# Patient Record
Sex: Female | Born: 1965 | Hispanic: No | Marital: Married | Smoking: Former smoker
Health system: Southern US, Community
[De-identification: ages and names within clinical notes are randomized; demographics above are authoritative.]

## PROBLEM LIST (undated history)

## (undated) DIAGNOSIS — F329 Major depressive disorder, single episode, unspecified: Secondary | ICD-10-CM

## (undated) DIAGNOSIS — R569 Unspecified convulsions: Secondary | ICD-10-CM

## (undated) DIAGNOSIS — M549 Dorsalgia, unspecified: Secondary | ICD-10-CM

## (undated) DIAGNOSIS — I809 Phlebitis and thrombophlebitis of unspecified site: Secondary | ICD-10-CM

## (undated) DIAGNOSIS — F419 Anxiety disorder, unspecified: Secondary | ICD-10-CM

## (undated) DIAGNOSIS — G43909 Migraine, unspecified, not intractable, without status migrainosus: Secondary | ICD-10-CM

## (undated) DIAGNOSIS — Z973 Presence of spectacles and contact lenses: Secondary | ICD-10-CM

## (undated) DIAGNOSIS — I82409 Acute embolism and thrombosis of unspecified deep veins of unspecified lower extremity: Secondary | ICD-10-CM

## (undated) DIAGNOSIS — E119 Type 2 diabetes mellitus without complications: Secondary | ICD-10-CM

## (undated) DIAGNOSIS — G629 Polyneuropathy, unspecified: Secondary | ICD-10-CM

## (undated) DIAGNOSIS — F32A Depression, unspecified: Secondary | ICD-10-CM

## (undated) DIAGNOSIS — D649 Anemia, unspecified: Secondary | ICD-10-CM

## (undated) HISTORY — PX: ESOPHAGOSCOPY WITH DILITATION: SHX5618

## (undated) HISTORY — PX: RECTAL SURGERY: SHX760

## (undated) HISTORY — PX: PICC LINE REMOVAL (ARMC HX): HXRAD1261

## (undated) HISTORY — PX: OTHER SURGICAL HISTORY: SHX169

## (undated) HISTORY — PX: LUMBAR FUSION: SHX111

## (undated) HISTORY — PX: COLONOSCOPY: SHX174

---

## 1999-03-05 HISTORY — PX: GASTRIC BYPASS: SHX52

## 2000-03-04 HISTORY — PX: OTHER SURGICAL HISTORY: SHX169

## 2004-01-03 ENCOUNTER — Ambulatory Visit: Payer: Self-pay | Admitting: Pain Medicine

## 2004-11-21 ENCOUNTER — Ambulatory Visit: Payer: Self-pay | Admitting: Family Medicine

## 2005-06-13 ENCOUNTER — Other Ambulatory Visit: Payer: Self-pay

## 2005-06-13 ENCOUNTER — Observation Stay: Payer: Self-pay | Admitting: Internal Medicine

## 2005-07-10 ENCOUNTER — Ambulatory Visit: Payer: Self-pay | Admitting: Family Medicine

## 2005-08-22 ENCOUNTER — Ambulatory Visit: Payer: Self-pay | Admitting: Family Medicine

## 2006-03-04 HISTORY — PX: ABDOMINAL HYSTERECTOMY: SHX81

## 2007-05-11 ENCOUNTER — Ambulatory Visit: Payer: Self-pay | Admitting: Family Medicine

## 2007-05-11 ENCOUNTER — Other Ambulatory Visit: Payer: Self-pay

## 2007-05-11 ENCOUNTER — Emergency Department: Payer: Self-pay | Admitting: Emergency Medicine

## 2009-02-12 ENCOUNTER — Emergency Department: Payer: Self-pay | Admitting: Internal Medicine

## 2011-03-12 ENCOUNTER — Emergency Department: Payer: Self-pay | Admitting: *Deleted

## 2011-03-12 LAB — COMPREHENSIVE METABOLIC PANEL
Albumin: 3.4 g/dL (ref 3.4–5.0)
Anion Gap: 10 (ref 7–16)
BUN: 15 mg/dL (ref 7–18)
Bilirubin,Total: 0.3 mg/dL (ref 0.2–1.0)
Chloride: 107 mmol/L (ref 98–107)
Creatinine: 0.34 mg/dL — ABNORMAL LOW (ref 0.60–1.30)
Glucose: 85 mg/dL (ref 65–99)
Osmolality: 281 (ref 275–301)
Potassium: 4 mmol/L (ref 3.5–5.1)
SGOT(AST): 29 U/L (ref 15–37)
SGPT (ALT): 27 U/L
Total Protein: 6.6 g/dL (ref 6.4–8.2)

## 2011-03-12 LAB — CK TOTAL AND CKMB (NOT AT ARMC)
CK, Total: 74 U/L (ref 21–215)
CK-MB: 0.7 ng/mL (ref 0.5–3.6)

## 2011-03-12 LAB — CBC
HCT: 32.7 % — ABNORMAL LOW (ref 35.0–47.0)
MCH: 30.2 pg (ref 26.0–34.0)
MCHC: 33.3 g/dL (ref 32.0–36.0)
MCV: 91 fL (ref 80–100)
Platelet: 203 10*3/uL (ref 150–440)
RDW: 15.5 % — ABNORMAL HIGH (ref 11.5–14.5)
WBC: 8.3 10*3/uL (ref 3.6–11.0)

## 2011-03-19 ENCOUNTER — Emergency Department: Payer: Self-pay | Admitting: Emergency Medicine

## 2011-03-21 ENCOUNTER — Ambulatory Visit: Payer: Self-pay | Admitting: Family Medicine

## 2011-06-24 ENCOUNTER — Emergency Department: Payer: Self-pay | Admitting: Emergency Medicine

## 2011-09-06 ENCOUNTER — Encounter (HOSPITAL_COMMUNITY): Payer: Self-pay | Admitting: Pharmacy Technician

## 2011-09-09 DIAGNOSIS — M5126 Other intervertebral disc displacement, lumbar region: Secondary | ICD-10-CM | POA: Diagnosis present

## 2011-09-10 NOTE — Pre-Procedure Instructions (Signed)
20 Alyssa Sanders  09/10/2011   Your procedure is scheduled on:  Monday September 16, 2011.  Report to [facility] at 1030 AM.  Call this number if you have problems the morning of surgery: [phone]   Remember:   Do not eat food:After Midnight.    Take these medicines the morning of surgery with A SIP OF WATER: Tramadol (Ultram) if needed for pain.   Do not wear jewelry, make-up or nail polish.  Do not wear lotions, powders, or perfumes.   Do not shave 48 hours prior to surgery.   Do not bring valuables to the hospital.  Contacts, dentures or bridgework may not be worn into surgery.  Leave suitcase in the car. After surgery it may be brought to your room.  For patients admitted to the hospital, checkout time is 11:00 AM the day of discharge.   Patients discharged the day of surgery will not be allowed to drive home.  Name and phone number of your driver:   Special Instructions: CHG Shower Use Special Wash: 1/2 bottle night before surgery and 1/2 bottle morning of surgery.   Please read over the following fact sheets that you were given: Pain Booklet, Coughing and Deep Breathing, MRSA Information and Surgical Site Infection Prevention

## 2011-09-11 ENCOUNTER — Encounter (HOSPITAL_COMMUNITY): Payer: Self-pay

## 2011-09-11 ENCOUNTER — Encounter (HOSPITAL_COMMUNITY)
Admission: RE | Admit: 2011-09-11 | Discharge: 2011-09-11 | Disposition: A | Discharge status: Home / Self Care | Payer: Worker's Compensation | Source: Ambulatory Visit | Attending: Physician Assistant | Admitting: Physician Assistant

## 2011-09-11 ENCOUNTER — Encounter (HOSPITAL_COMMUNITY)
Admission: RE | Admit: 2011-09-11 | Discharge: 2011-09-11 | Disposition: A | Discharge status: Home / Self Care | Payer: Worker's Compensation | Source: Ambulatory Visit | Attending: Orthopaedic Surgery | Admitting: Orthopaedic Surgery

## 2011-09-11 HISTORY — DX: Migraine, unspecified, not intractable, without status migrainosus: G43.909

## 2011-09-11 HISTORY — DX: Anemia, unspecified: D64.9

## 2011-09-11 LAB — URINALYSIS, ROUTINE W REFLEX MICROSCOPIC
Bilirubin Urine: NEGATIVE
Glucose, UA: NEGATIVE mg/dL
Hgb urine dipstick: NEGATIVE
Ketones, ur: NEGATIVE mg/dL
pH: 6.5 (ref 5.0–8.0)

## 2011-09-11 LAB — BASIC METABOLIC PANEL
BUN: 11 mg/dL (ref 6–23)
CO2: 28 mEq/L (ref 19–32)
Chloride: 104 mEq/L (ref 96–112)
Creatinine, Ser: 0.48 mg/dL — ABNORMAL LOW (ref 0.50–1.10)
Glucose, Bld: 89 mg/dL (ref 70–99)

## 2011-09-11 LAB — CBC
HCT: 33.5 % — ABNORMAL LOW (ref 36.0–46.0)
MCH: 29.2 pg (ref 26.0–34.0)
MCV: 85.9 fL (ref 78.0–100.0)
RDW: 15.1 % (ref 11.5–15.5)
WBC: 7.3 10*3/uL (ref 4.0–10.5)

## 2011-09-11 LAB — URINE MICROSCOPIC-ADD ON

## 2011-09-11 NOTE — Progress Notes (Signed)
Patient had a stress test approximately 10-15 years ago, but denied having a cardiac cath or sleep study.

## 2011-09-15 MED ORDER — CEFAZOLIN SODIUM-DEXTROSE 2-3 GM-% IV SOLR
2.0000 g | INTRAVENOUS | Status: AC
  Administered 2011-09-16: 2 g via INTRAVENOUS
  Filled 2011-09-15: qty 50

## 2011-09-16 ENCOUNTER — Encounter (HOSPITAL_COMMUNITY): Payer: Self-pay | Admitting: Certified Registered"

## 2011-09-16 ENCOUNTER — Encounter (HOSPITAL_COMMUNITY): Payer: Self-pay | Admitting: *Deleted

## 2011-09-16 ENCOUNTER — Ambulatory Visit (HOSPITAL_COMMUNITY): Payer: Worker's Compensation | Admitting: Certified Registered"

## 2011-09-16 ENCOUNTER — Observation Stay (HOSPITAL_COMMUNITY)
Admission: RE | Admit: 2011-09-16 | Discharge: 2011-09-18 | Disposition: A | Discharge status: Home / Self Care | Payer: Worker's Compensation | Source: Ambulatory Visit | Attending: Orthopaedic Surgery | Admitting: Orthopaedic Surgery

## 2011-09-16 ENCOUNTER — Encounter (HOSPITAL_COMMUNITY)
Admission: RE | Disposition: A | Discharge status: Home / Self Care | Payer: Self-pay | Source: Ambulatory Visit | Attending: Orthopaedic Surgery

## 2011-09-16 ENCOUNTER — Ambulatory Visit (HOSPITAL_COMMUNITY): Payer: Worker's Compensation

## 2011-09-16 DIAGNOSIS — J4489 Other specified chronic obstructive pulmonary disease: Secondary | ICD-10-CM | POA: Insufficient documentation

## 2011-09-16 DIAGNOSIS — Z01818 Encounter for other preprocedural examination: Secondary | ICD-10-CM | POA: Insufficient documentation

## 2011-09-16 DIAGNOSIS — J449 Chronic obstructive pulmonary disease, unspecified: Secondary | ICD-10-CM | POA: Insufficient documentation

## 2011-09-16 DIAGNOSIS — Z01811 Encounter for preprocedural respiratory examination: Secondary | ICD-10-CM | POA: Insufficient documentation

## 2011-09-16 DIAGNOSIS — Z01812 Encounter for preprocedural laboratory examination: Secondary | ICD-10-CM | POA: Insufficient documentation

## 2011-09-16 DIAGNOSIS — M5126 Other intervertebral disc displacement, lumbar region: Principal | ICD-10-CM | POA: Diagnosis present

## 2011-09-16 DIAGNOSIS — E119 Type 2 diabetes mellitus without complications: Secondary | ICD-10-CM | POA: Insufficient documentation

## 2011-09-16 HISTORY — PX: LUMBAR LAMINECTOMY: SHX95

## 2011-09-16 LAB — GLUCOSE, CAPILLARY

## 2011-09-16 SURGERY — MICRODISCECTOMY LUMBAR LAMINECTOMY
Anesthesia: General | Site: Spine Lumbar | Laterality: Bilateral | Wound class: Clean

## 2011-09-16 MED ORDER — PROPOFOL 10 MG/ML IV EMUL
INTRAVENOUS | Status: DC | PRN
  Administered 2011-09-16: 40 mg via INTRAVENOUS
  Administered 2011-09-16: 180 mg via INTRAVENOUS

## 2011-09-16 MED ORDER — BUPIVACAINE HCL (PF) 0.25 % IJ SOLN
INTRAMUSCULAR | Status: AC
  Filled 2011-09-16: qty 30

## 2011-09-16 MED ORDER — PROPOFOL 10 MG/ML IV EMUL: INTRAVENOUS | Status: DC | PRN

## 2011-09-16 MED ORDER — CEFAZOLIN SODIUM 1-5 GM-% IV SOLN
1.0000 g | Freq: Three times a day (TID) | INTRAVENOUS | Status: AC
  Administered 2011-09-16 – 2011-09-17 (×2): 1 g via INTRAVENOUS
  Filled 2011-09-16 (×2): qty 50

## 2011-09-16 MED ORDER — ACETAMINOPHEN 325 MG PO TABS: 650.0000 mg | ORAL_TABLET | ORAL | Status: DC | PRN

## 2011-09-16 MED ORDER — PANTOPRAZOLE SODIUM 40 MG IV SOLR
40.0000 mg | Freq: Every day | INTRAVENOUS | Status: DC
  Administered 2011-09-16: 40 mg via INTRAVENOUS
  Filled 2011-09-16 (×2): qty 40

## 2011-09-16 MED ORDER — GLYCOPYRROLATE 0.2 MG/ML IJ SOLN
INTRAMUSCULAR | Status: DC | PRN
  Administered 2011-09-16: .8 mg via INTRAVENOUS

## 2011-09-16 MED ORDER — HYDROMORPHONE HCL PF 1 MG/ML IJ SOLN
INTRAMUSCULAR | Status: AC
  Filled 2011-09-16: qty 1

## 2011-09-16 MED ORDER — KETOROLAC TROMETHAMINE 30 MG/ML IJ SOLN
30.0000 mg | Freq: Four times a day (QID) | INTRAMUSCULAR | Status: AC
  Administered 2011-09-16 – 2011-09-17 (×4): 30 mg via INTRAVENOUS
  Filled 2011-09-16 (×4): qty 1

## 2011-09-16 MED ORDER — PROMETHAZINE HCL 25 MG/ML IJ SOLN
6.2500 mg | INTRAMUSCULAR | Status: DC | PRN
  Administered 2011-09-16: 6.25 mg via INTRAVENOUS

## 2011-09-16 MED ORDER — ACETAMINOPHEN 650 MG RE SUPP: 650.0000 mg | RECTAL | Status: DC | PRN

## 2011-09-16 MED ORDER — DOCUSATE SODIUM 100 MG PO CAPS
100.0000 mg | ORAL_CAPSULE | Freq: Two times a day (BID) | ORAL | Status: DC
  Administered 2011-09-16 – 2011-09-18 (×4): 100 mg via ORAL
  Filled 2011-09-16 (×6): qty 1

## 2011-09-16 MED ORDER — SENNOSIDES-DOCUSATE SODIUM 8.6-50 MG PO TABS: 1.0000 | ORAL_TABLET | Freq: Every evening | ORAL | Status: DC | PRN

## 2011-09-16 MED ORDER — PHENYLEPHRINE HCL 10 MG/ML IJ SOLN
INTRAMUSCULAR | Status: DC | PRN
  Administered 2011-09-16 (×2): 80 ug via INTRAVENOUS
  Administered 2011-09-16 (×3): 40 ug via INTRAVENOUS

## 2011-09-16 MED ORDER — MIDAZOLAM HCL 2 MG/2ML IJ SOLN: 0.5000 mg | Freq: Once | INTRAMUSCULAR | Status: DC | PRN

## 2011-09-16 MED ORDER — CHLORHEXIDINE GLUCONATE 4 % EX LIQD: 60.0000 mL | Freq: Once | CUTANEOUS | Status: DC

## 2011-09-16 MED ORDER — METHOCARBAMOL 100 MG/ML IJ SOLN
500.0000 mg | Freq: Four times a day (QID) | INTRAVENOUS | Status: DC | PRN
  Administered 2011-09-16 – 2011-09-17 (×4): 500 mg via INTRAVENOUS
  Filled 2011-09-16 (×7): qty 5

## 2011-09-16 MED ORDER — MEPERIDINE HCL 25 MG/ML IJ SOLN: 6.2500 mg | INTRAMUSCULAR | Status: DC | PRN

## 2011-09-16 MED ORDER — NEOSTIGMINE METHYLSULFATE 1 MG/ML IJ SOLN
INTRAMUSCULAR | Status: DC | PRN
  Administered 2011-09-16: 5 mg via INTRAVENOUS

## 2011-09-16 MED ORDER — ONDANSETRON HCL 4 MG/2ML IJ SOLN
INTRAMUSCULAR | Status: DC | PRN
  Administered 2011-09-16: 4 mg via INTRAVENOUS

## 2011-09-16 MED ORDER — AMITRIPTYLINE HCL 100 MG PO TABS
100.0000 mg | ORAL_TABLET | Freq: Every day | ORAL | Status: DC
  Administered 2011-09-16 – 2011-09-17 (×2): 100 mg via ORAL
  Filled 2011-09-16 (×3): qty 1

## 2011-09-16 MED ORDER — PHENOL 1.4 % MT LIQD
1.0000 | OROMUCOSAL | Status: DC | PRN
  Administered 2011-09-16: 1 via OROMUCOSAL
  Filled 2011-09-16: qty 177

## 2011-09-16 MED ORDER — METHOCARBAMOL 500 MG PO TABS
ORAL_TABLET | ORAL | Status: AC
  Filled 2011-09-16: qty 1

## 2011-09-16 MED ORDER — SUMATRIPTAN SUCCINATE 50 MG PO TABS
50.0000 mg | ORAL_TABLET | ORAL | Status: DC | PRN
  Filled 2011-09-16: qty 1

## 2011-09-16 MED ORDER — KCL IN DEXTROSE-NACL 20-5-0.45 MEQ/L-%-% IV SOLN
INTRAVENOUS | Status: DC
  Administered 2011-09-16: 16:00:00 via INTRAVENOUS
  Filled 2011-09-16 (×5): qty 1000

## 2011-09-16 MED ORDER — SODIUM CHLORIDE 0.9 % IJ SOLN: 3.0000 mL | INTRAMUSCULAR | Status: DC | PRN

## 2011-09-16 MED ORDER — BUPIVACAINE HCL (PF) 0.25 % IJ SOLN
INTRAMUSCULAR | Status: DC | PRN
  Administered 2011-09-16: 10 mL

## 2011-09-16 MED ORDER — HYDROMORPHONE HCL PF 1 MG/ML IJ SOLN
0.5000 mg | INTRAMUSCULAR | Status: DC | PRN
  Administered 2011-09-16 – 2011-09-18 (×11): 1 mg via INTRAVENOUS
  Filled 2011-09-16 (×12): qty 1

## 2011-09-16 MED ORDER — ONDANSETRON HCL 4 MG/2ML IJ SOLN
4.0000 mg | INTRAMUSCULAR | Status: DC | PRN
  Administered 2011-09-16 – 2011-09-17 (×2): 4 mg via INTRAVENOUS
  Filled 2011-09-16 (×2): qty 2

## 2011-09-16 MED ORDER — DEXTROSE 5 % IV SOLN
INTRAVENOUS | Status: DC | PRN
  Administered 2011-09-16: 12:00:00 via INTRAVENOUS

## 2011-09-16 MED ORDER — BISACODYL 10 MG RE SUPP: 10.0000 mg | Freq: Every day | RECTAL | Status: DC | PRN

## 2011-09-16 MED ORDER — TRAMADOL HCL 50 MG PO TABS
50.0000 mg | ORAL_TABLET | Freq: Four times a day (QID) | ORAL | Status: DC | PRN
  Administered 2011-09-17 – 2011-09-18 (×2): 50 mg via ORAL
  Filled 2011-09-16 (×2): qty 1

## 2011-09-16 MED ORDER — LACTATED RINGERS IV SOLN
INTRAVENOUS | Status: DC | PRN
  Administered 2011-09-16 (×2): via INTRAVENOUS

## 2011-09-16 MED ORDER — HYDROCODONE-ACETAMINOPHEN 5-325 MG PO TABS
1.0000 | ORAL_TABLET | ORAL | Status: DC | PRN
  Administered 2011-09-16: 1 via ORAL
  Administered 2011-09-17 (×2): 2 via ORAL
  Filled 2011-09-16: qty 2
  Filled 2011-09-16: qty 1
  Filled 2011-09-16 (×3): qty 2

## 2011-09-16 MED ORDER — MIDAZOLAM HCL 5 MG/5ML IJ SOLN
INTRAMUSCULAR | Status: DC | PRN
  Administered 2011-09-16 (×2): 1 mg via INTRAVENOUS

## 2011-09-16 MED ORDER — HYDROMORPHONE HCL PF 1 MG/ML IJ SOLN
0.2500 mg | INTRAMUSCULAR | Status: DC | PRN
  Administered 2011-09-16 (×2): 0.5 mg via INTRAVENOUS

## 2011-09-16 MED ORDER — LIDOCAINE HCL (CARDIAC) 20 MG/ML IV SOLN
INTRAVENOUS | Status: DC | PRN
  Administered 2011-09-16: 100 mg via INTRAVENOUS

## 2011-09-16 MED ORDER — KETOROLAC TROMETHAMINE 30 MG/ML IJ SOLN
INTRAMUSCULAR | Status: AC
  Filled 2011-09-16: qty 1

## 2011-09-16 MED ORDER — SODIUM CHLORIDE 0.9 % IJ SOLN: 3.0000 mL | Freq: Two times a day (BID) | INTRAMUSCULAR | Status: DC

## 2011-09-16 MED ORDER — SODIUM CHLORIDE 0.9 % IV SOLN
250.0000 mL | INTRAVENOUS | Status: DC
  Administered 2011-09-16: 250 mL via INTRAVENOUS

## 2011-09-16 MED ORDER — OXYCODONE-ACETAMINOPHEN 5-325 MG PO TABS
ORAL_TABLET | ORAL | Status: AC
  Filled 2011-09-16: qty 2

## 2011-09-16 MED ORDER — 0.9 % SODIUM CHLORIDE (POUR BTL) OPTIME
TOPICAL | Status: DC | PRN
  Administered 2011-09-16: 1000 mL

## 2011-09-16 MED ORDER — ROCURONIUM BROMIDE 100 MG/10ML IV SOLN
INTRAVENOUS | Status: DC | PRN
  Administered 2011-09-16: 50 mg via INTRAVENOUS

## 2011-09-16 MED ORDER — METHOCARBAMOL 500 MG PO TABS
500.0000 mg | ORAL_TABLET | Freq: Four times a day (QID) | ORAL | Status: DC | PRN
  Administered 2011-09-16 – 2011-09-18 (×2): 500 mg via ORAL
  Filled 2011-09-16 (×3): qty 1

## 2011-09-16 MED ORDER — FENTANYL CITRATE 0.05 MG/ML IJ SOLN
INTRAMUSCULAR | Status: DC | PRN
  Administered 2011-09-16: 50 ug via INTRAVENOUS
  Administered 2011-09-16: 100 ug via INTRAVENOUS
  Administered 2011-09-16: 50 ug via INTRAVENOUS

## 2011-09-16 MED ORDER — OXYCODONE-ACETAMINOPHEN 5-325 MG PO TABS
1.0000 | ORAL_TABLET | ORAL | Status: DC | PRN
  Administered 2011-09-16 (×2): 2 via ORAL
  Administered 2011-09-17 (×2): 1 via ORAL
  Administered 2011-09-17 – 2011-09-18 (×6): 2 via ORAL
  Filled 2011-09-16 (×9): qty 2

## 2011-09-16 MED ORDER — PROMETHAZINE HCL 25 MG/ML IJ SOLN
INTRAMUSCULAR | Status: AC
  Filled 2011-09-16: qty 1

## 2011-09-16 MED ORDER — KCL IN DEXTROSE-NACL 20-5-0.45 MEQ/L-%-% IV SOLN
INTRAVENOUS | Status: AC
  Filled 2011-09-16: qty 1000

## 2011-09-16 MED ORDER — TRAMADOL HCL ER 100 MG PO TB24: 200.0000 mg | ORAL_TABLET | Freq: Every day | ORAL | Status: DC | PRN

## 2011-09-16 MED ORDER — LACTATED RINGERS IV SOLN
INTRAVENOUS | Status: DC
  Administered 2011-09-16: 12:00:00 via INTRAVENOUS

## 2011-09-16 MED ORDER — MENTHOL 3 MG MT LOZG: 1.0000 | LOZENGE | OROMUCOSAL | Status: DC | PRN

## 2011-09-16 MED ORDER — OXYCODONE-ACETAMINOPHEN 5-325 MG PO TABS: 1.0000 | ORAL_TABLET | ORAL | Status: AC | PRN

## 2011-09-16 SURGICAL SUPPLY — 49 items
BENZOIN TINCTURE PRP APPL 2/3 (GAUZE/BANDAGES/DRESSINGS) ×2 IMPLANT
BUR ROUND FLUTED 4 SOFT TCH (BURR) ×2 IMPLANT
CLOTH BEACON ORANGE TIMEOUT ST (SAFETY) ×2 IMPLANT
CORDS BIPOLAR (ELECTRODE) ×2 IMPLANT
COVER SURGICAL LIGHT HANDLE (MISCELLANEOUS) ×2 IMPLANT
DERMABOND ADVANCED (GAUZE/BANDAGES/DRESSINGS) ×1
DERMABOND ADVANCED .7 DNX12 (GAUZE/BANDAGES/DRESSINGS) ×1 IMPLANT
DRAPE MICROSCOPE LEICA (MISCELLANEOUS) ×2 IMPLANT
DRAPE PROXIMA HALF (DRAPES) ×4 IMPLANT
DRESSING ADAPTIC 1/2  N-ADH (PACKING) ×2 IMPLANT
DRSG EMULSION OIL 3X3 NADH (GAUZE/BANDAGES/DRESSINGS) ×2 IMPLANT
DRSG MEPILEX BORDER 4X8 (GAUZE/BANDAGES/DRESSINGS) ×2 IMPLANT
DURAPREP 26ML APPLICATOR (WOUND CARE) ×2 IMPLANT
ELECT REM PT RETURN 9FT ADLT (ELECTROSURGICAL) ×2
ELECTRODE REM PT RTRN 9FT ADLT (ELECTROSURGICAL) ×1 IMPLANT
GLOVE BIOGEL PI IND STRL 7.5 (GLOVE) ×2 IMPLANT
GLOVE BIOGEL PI IND STRL 8 (GLOVE) ×1 IMPLANT
GLOVE BIOGEL PI INDICATOR 7.5 (GLOVE) ×2
GLOVE BIOGEL PI INDICATOR 8 (GLOVE) ×1
GLOVE ECLIPSE 7.0 STRL STRAW (GLOVE) ×4 IMPLANT
GLOVE ORTHO TXT STRL SZ7.5 (GLOVE) ×2 IMPLANT
GOWN PREVENTION PLUS LG XLONG (DISPOSABLE) IMPLANT
GOWN STRL NON-REIN LRG LVL3 (GOWN DISPOSABLE) ×6 IMPLANT
KIT BASIN OR (CUSTOM PROCEDURE TRAY) ×2 IMPLANT
KIT ROOM TURNOVER OR (KITS) ×2 IMPLANT
MANIFOLD NEPTUNE II (INSTRUMENTS) ×2 IMPLANT
NDL SUT .5 MAYO 1.404X.05X (NEEDLE) ×1 IMPLANT
NEEDLE HYPO 25GX1X1/2 BEV (NEEDLE) ×2 IMPLANT
NEEDLE MAYO TAPER (NEEDLE) ×1
NEEDLE SPNL 18GX3.5 QUINCKE PK (NEEDLE) ×2 IMPLANT
NS IRRIG 1000ML POUR BTL (IV SOLUTION) ×2 IMPLANT
PACK LAMINECTOMY ORTHO (CUSTOM PROCEDURE TRAY) ×2 IMPLANT
PAD ARMBOARD 7.5X6 YLW CONV (MISCELLANEOUS) ×4 IMPLANT
PATTIES SURGICAL .5 X.5 (GAUZE/BANDAGES/DRESSINGS) IMPLANT
PATTIES SURGICAL .75X.75 (GAUZE/BANDAGES/DRESSINGS) IMPLANT
SPONGE GAUZE 4X4 12PLY (GAUZE/BANDAGES/DRESSINGS) ×2 IMPLANT
STAPLER VISISTAT 35W (STAPLE) ×2 IMPLANT
STRIP CLOSURE SKIN 1/2X4 (GAUZE/BANDAGES/DRESSINGS) ×2 IMPLANT
SUT VIC AB 2-0 CT1 27 (SUTURE) ×1
SUT VIC AB 2-0 CT1 TAPERPNT 27 (SUTURE) ×1 IMPLANT
SUT VICRYL 0 TIES 12 18 (SUTURE) ×2 IMPLANT
SUT VICRYL 4-0 PS2 18IN ABS (SUTURE) IMPLANT
SUT VICRYL AB 2 0 TIES (SUTURE) ×2 IMPLANT
SYR 20ML ECCENTRIC (SYRINGE) IMPLANT
SYR CONTROL 10ML LL (SYRINGE) ×2 IMPLANT
TAPE CLOTH SURG 4X10 WHT LF (GAUZE/BANDAGES/DRESSINGS) ×2 IMPLANT
TOWEL OR 17X24 6PK STRL BLUE (TOWEL DISPOSABLE) ×2 IMPLANT
TOWEL OR 17X26 10 PK STRL BLUE (TOWEL DISPOSABLE) ×2 IMPLANT
WATER STERILE IRR 1000ML POUR (IV SOLUTION) ×2 IMPLANT

## 2011-09-16 NOTE — Preoperative (Signed)
Beta Blockers   Reason not to administer Beta Blockers:Not Applicable 

## 2011-09-16 NOTE — Anesthesia Postprocedure Evaluation (Signed)
  Anesthesia Post-op Note  Patient: Alyssa Sanders  Procedure(s) Performed: Procedure(s) (LRB): MICRODISCECTOMY LUMBAR LAMINECTOMY (Bilateral)  Patient Location: PACU  Anesthesia Type: General  Level of Consciousness: awake, alert  and oriented  Airway and Oxygen Therapy: Patient Spontanous Breathing and Patient connected to nasal cannula oxygen  Post-op Pain: none  Post-op Assessment: Post-op Vital signs reviewed, Patient's Cardiovascular Status Stable, Respiratory Function Stable, Patent Airway, No signs of Nausea or vomiting and Pain level controlled  Post-op Vital Signs: Reviewed and stable  Complications: No apparent anesthesia complications
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
## 2011-09-16 NOTE — Transfer of Care (Signed)
Immediate Anesthesia Transfer of Care Note  Patient: Alyssa Sanders  Procedure(s) Performed: Procedure(s) (LRB): MICRODISCECTOMY LUMBAR LAMINECTOMY (Bilateral)  Patient Location: PACU  Anesthesia Type: General  Level of Consciousness: awake, oriented and patient cooperative  Airway & Oxygen Therapy: Patient Spontanous Breathing and Patient connected to nasal cannula oxygen  Post-op Assessment: Report given to PACU RN, Post -op Vital signs reviewed and stable and Patient moving all extremities  Post vital signs: Reviewed and stable  Complications: No apparent anesthesia complications

## 2011-09-16 NOTE — H&P (View-Only) (Signed)
Patient had a stress test approximately 10-15 years ago, but denied having a cardiac cath or sleep study.  

## 2011-09-16 NOTE — Anesthesia Preprocedure Evaluation (Addendum)
Anesthesia Evaluation  Patient identified by MRN, date of birth, ID band Patient awake, Patient confused and Patient unresponsive    Reviewed: Allergy & Precautions, H&P , NPO status , Patient's Chart, lab work & pertinent test results, reviewed documented beta blocker date and time   History of Anesthesia Complications Negative for: history of anesthetic complications  Airway Mallampati: II TM Distance: >3 FB Neck ROM: Full    Dental  (+) Teeth Intact, Dental Advisory Given, Loose and Chipped   Pulmonary COPD (last inhaler march 1013) COPD inhaler, former smoker (quit 12 years ),    Pulmonary exam normal       Cardiovascular negative cardio ROS  Rhythm:Regular Rate:Normal     Neuro/Psych  Headaches,    GI/Hepatic negative GI ROS, Neg liver ROS,   Endo/Other  Well Controlled, Type obesity  Renal/GU negative Renal ROS     Musculoskeletal   Abdominal (+) + obese,   Peds  Hematology   Anesthesia Other Findings Right upper chip, left lower chip  Reproductive/Obstetrics                         Anesthesia Physical Anesthesia Plan  ASA: III  Anesthesia Plan: General   Post-op Pain Management:    Induction: Intravenous  Airway Management Planned: Oral ETT  Additional Equipment:   Intra-op Plan:   Post-operative Plan: Extubation in OR  Informed Consent: I have reviewed the patients History and Physical, chart, labs and discussed the procedure including the risks, benefits and alternatives for the proposed anesthesia with the patient or authorized representative who has indicated his/her understanding and acceptance.   Dental advisory given  Plan Discussed with: CRNA and Surgeon  Anesthesia Plan Comments: (Plan routine monitors, GETA)        Anesthesia Quick Evaluation

## 2011-09-16 NOTE — Brief Op Note (Cosign Needed)
09/16/2011  2:28 PM  PATIENT:  Alyssa Sanders  46 y.o. 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  PROCEDURE:  Procedure(s) (LRB): MICRODISCECTOMY LUMBAR LAMINECTOMY (Bilateral) L4-5 SURGEON:  Surgeon(s) and Role:    * Eldred Manges, MD - Primary  PHYSICIAN ASSISTANT: Maud Deed PAC  ASSISTANTS: none   ANESTHESIA:   general  EBL:  Total I/O In: 1050 [I.V.:1050] Out: 10 [Blood:10]  BLOOD ADMINISTERED:none  DRAINS: none   LOCAL MEDICATIONS USED:  MARCAINE     SPECIMEN:  No Specimen  DISPOSITION OF SPECIMEN:  N/A  COUNTS:  YES  TOURNIQUET:  * No tourniquets in log *  DICTATION: .Note written in EPIC  PLAN OF CARE: Admit for overnight observation  PATIENT DISPOSITION:  PACU - hemodynamically stable.   Delay start of Pharmacological VTE agent (>24hrs) due to surgical blood loss or risk of bleeding: yes

## 2011-09-16 NOTE — Interval H&P Note (Signed)
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 After consideration of risks, benefits and other options for treatment, the patient has consented to  Procedure(s) (LRB): MICRODISCECTOMY LUMBAR LAMINECTOMY (N/A) as a surgical intervention .  The patient's history has been reviewed, patient examined, no change in status, stable for surgery.  I have reviewed the patients' chart and labs.  Questions were answered to the patient's satisfaction.     [name] C

## 2011-09-16 NOTE — Anesthesia Procedure Notes (Signed)
Procedure Name: Intubation Date/Time: 09/16/2011 12:39 PM Performed by: Darcey Nora B Pre-anesthesia Checklist: Patient identified, Emergency Drugs available, Suction available and Patient being monitored Patient Re-evaluated:Patient Re-evaluated prior to inductionOxygen Delivery Method: Circle system utilized Preoxygenation: Pre-oxygenation with 100% oxygen Intubation Type: IV induction Ventilation: Mask ventilation without difficulty Laryngoscope Size: Mac and 3 Grade View: Grade II Tube type: Oral Tube size: 7.5 mm Number of attempts: 1 Airway Equipment and Method: Stylet Placement Confirmation: ETT inserted through vocal cords under direct vision,  breath sounds checked- equal and bilateral and positive ETCO2 Secured at: 21 (cm at teeth) cm Tube secured with: Tape Dental Injury: Teeth and Oropharynx as per pre-operative assessment

## 2011-09-17 ENCOUNTER — Encounter (HOSPITAL_COMMUNITY): Payer: Self-pay | Admitting: Orthopaedic Surgery

## 2011-09-17 MED ORDER — LORAZEPAM 0.5 MG PO TABS
0.5000 mg | ORAL_TABLET | Freq: Four times a day (QID) | ORAL | Status: DC | PRN
  Administered 2011-09-17: 0.5 mg via ORAL
  Filled 2011-09-17: qty 1

## 2011-09-17 MED ORDER — PANTOPRAZOLE SODIUM 40 MG PO TBEC
40.0000 mg | DELAYED_RELEASE_TABLET | Freq: Every day | ORAL | Status: DC
  Administered 2011-09-17: 40 mg via ORAL
  Filled 2011-09-17: qty 1

## 2011-09-17 MED ORDER — KETOROLAC TROMETHAMINE 30 MG/ML IJ SOLN
INTRAMUSCULAR | Status: AC
  Filled 2011-09-17: qty 1

## 2011-09-17 MED ORDER — KETOROLAC TROMETHAMINE 30 MG/ML IJ SOLN
30.0000 mg | Freq: Four times a day (QID) | INTRAMUSCULAR | Status: DC
  Administered 2011-09-17 – 2011-09-18 (×3): 30 mg via INTRAVENOUS
  Filled 2011-09-17 (×2): qty 1

## 2011-09-17 NOTE — Plan of Care (Signed)
Patient requests to have bed elevated high off the ground and refused to have the bed lowered in the Safe Position down on the floor. Patient educated on SAFE measures and still refused. Bed left in high position.

## 2011-09-17 NOTE — Progress Notes (Signed)
Subjective: 1 Day Post-Op Procedure(s) (LRB): MICRODISCECTOMY LUMBAR LAMINECTOMY (Bilateral) Patient reports pain as 9 on 0-10 scale.   Reports she has not had control of pain all day.  Using IV dilaudid and po percocet.  Due now for Robaxin.   Pt tearful and anxious.  Complains of severe right foot pain and pain radiating down back of right leg.  States she can not put all her weight on the right foot.  Has only been out of bed to bathroom once.  Didn't sleep last night because of pain. Denies nausea or vomiting. Voiding ok.  Min flatus. Objective: Vital signs in last 24 hours: Temp:  [98.1 F (36.7 C)] 98.1 F (36.7 C) (07/16 0531) Pulse Rate:  [66-68] 66  (07/16 0531) Resp:  [18-20] 18  (07/16 0531) BP: (101-106)/(40-48) 106/48 mmHg (07/16 0531) SpO2:  [97 %-100 %] 97 % (07/16 0531)  Intake/Output from previous day: 07/15 0701 - 07/16 0700 In: 2050 [I.V.:2050] Out: 10 [Blood:10] Intake/Output this shift:    No results found for this basename: HGB:5 in the last 72 hours No results found for this basename: WBC:2,RBC:2,HCT:2,PLT:2 in the last 72 hours No results found for this basename: NA:2,K:2,CL:2,CO2:2,BUN:2,CREATININE:2,GLUCOSE:2,CALCIUM:2 in the last 72 hours No results found for this basename: LABPT:2,INR:2 in the last 72 hours  Sensation intact distally Intact pulses distally Incision: dressing C/D/I Pain with dorsiflexion of right foot.  No focal weakness.  -SLR.  Calves soft and nontender.  Assessment/Plan: 1 Day Post-Op Procedure(s) (LRB): MICRODISCECTOMY LUMBAR LAMINECTOMY (Bilateral) Up with therapy Plan for discharge tomorrow after pt seen and evaluated by PT for ambulation and gait training.  Work towards better pain control. Ativan for anxiety.  Farrie Sann M 09/17/2011, 5:03 PM

## 2011-09-17 NOTE — Op Note (Signed)
Alyssa Sanders, Alyssa Sanders             ACCOUNT NO.:  [id number]  MEDICAL RECORD NO.:  [id number]  LOCATION:  [id number]                        FACILITY:  [facility]  PHYSICIAN:  Morayma Godown C. Ophelia Charter, M.D.    DATE OF BIRTH:  1965/11/15  DATE OF PROCEDURE:  09/16/2011 DATE OF DISCHARGE:                              OPERATIVE REPORT   PREOPERATIVE DIAGNOSIS:  Central herniated nucleus pulposus L4-5 with bilateral lateral recess stenosis and foraminal stenosis.  POSTOPERATIVE DIAGNOSIS:  Central herniated nucleus pulposus L4-5 with bilateral lateral recess stenosis and foraminal stenosis.  PROCEDURE:  L4-5 bilateral microdiskectomy and foraminotomy.  SURGEON:  Kamren Heskett C. Ophelia Charter, MD  ASSISTANT:  Wende Neighbors, PA-C, medically necessary and present for the entire procedure.  ANESTHESIA:  GOT.  EBL:  Less than 100 mL.  COMPLICATIONS:  None.  INDICATIONS:  This patient has had bilateral symptoms with central HNP large protruding which extends all the way across the right and left with bilateral nerve root compression.  She had been treated conservatively.  Symptoms have been present for greater than 9 months. She has failed physical therapy, epidural injections, weight loss of over 100 pounds with persistent symptoms and recent MRI showed persistent nerve root compression.  PROCEDURE IN DETAIL:  After induction of general anesthesia, orotracheal intubation, preoperative Ancef prophylaxis, the patient placed prone on chest rolls, careful padding, arms at 90/90.  Back was prepped with DuraPrep.  The area was squared with towels, Betadine, Steri-Drape applied.  Time-out procedure was completed.  Midline incision was made after spinal needle was placed.  Cross-table lateral x-ray showed that the needle was at the pedicle of L4.  Incision was made starting at that level extending distally subperiosteal dissection.  The second x-ray was taken with a Kocher clamp placed just at the top edge of the  disk clamping the spinous process.  This correctly confirmed the L4-5 level. Foraminotomies was performed in right and left sides removing thick chunks of ligament.  Dura was carefully protected.  Operative microscope was used, and microdiskectomy was performed first on the right side, with a large bulging disk encounter that was slightly cephalad to the level of the interspace.  Once the annulus was incised with slight pressure midline to the chunks of disk protruded on removing micropituitary.  Abstained up and down.  Long pituitaries were used to perform microdiskectomy pushing the fragments away from midline down the disk space to the middle, then removing them.  Some overhang bone was removed out to the level of the pedicle, partial facet was removed more on the right side than left side.  Operative microscope was adjusted. At this point, I switched sides with Maud Deed PA-C and nerve root was again gently retracted away from the lateral wall with direct visualization and long bipolars cautery were used for some epidural vein coagulation.  Soft tissue was all cleaned off of the disk, anulus was incised and again chunks of disk removed with micropituitary, straight long pituitaries until the disk was decompressed, no longer protruding, hockey-stick was able to be swept 180 degrees from both sides with no areas of compression and a ball-tip nerve probe right angle from the opposite side touching with no areas  of compression in the midline.  All remaining ligament was removed from the lateral gutter.  Nerve root was well decompressed and operative field was irrigated with saline solution.  Fascia closed with 0 Vicryl, 2-0 Vicryl, subcutaneous tissue, 4-0 Vicryl subcuticular skin closure, and Dermabond and then postop dressing.  Instrument count and needle count was correct.     Arles Rumbold C. Ophelia Charter, M.D.     MCY/MEDQ  D:  09/16/2011  T:  09/17/2011  Job:  [id number]

## 2011-09-18 MED ORDER — ZOLPIDEM TARTRATE 10 MG PO TABS
10.0000 mg | ORAL_TABLET | Freq: Every evening | ORAL | Status: DC | PRN
  Administered 2011-09-18: 10 mg via ORAL
  Filled 2011-09-18: qty 1

## 2011-09-18 MED ORDER — ZOLPIDEM TARTRATE 10 MG PO TABS: 10.0000 mg | ORAL_TABLET | Freq: Every evening | ORAL | Status: DC | PRN

## 2011-09-18 MED ORDER — OXYCODONE-ACETAMINOPHEN 5-325 MG PO TABS: 1.0000 | ORAL_TABLET | Freq: Four times a day (QID) | ORAL | Status: AC | PRN

## 2011-09-18 MED ORDER — METHOCARBAMOL 500 MG PO TABS: 500.0000 mg | ORAL_TABLET | Freq: Three times a day (TID) | ORAL | Status: AC | PRN

## 2011-09-18 NOTE — Progress Notes (Signed)
Subjective: 2 Days Post-Op Procedure(s) (LRB): MICRODISCECTOMY LUMBAR LAMINECTOMY (Bilateral) Patient reports pain as mild.   Much more comfortable this am.  Still not walking.  Will be seen by PT today. No nausea.  +flatus.  -BM. Still having leg pain,but improved from yesterday Objective: Vital signs in last 24 hours: Temp:  [97.3 F (36.3 C)-98 F (36.7 C)] 97.3 F (36.3 C) (07/17 0626) Pulse Rate:  [76-81] 76  (07/17 0626) Resp:  [18] 18  (07/17 0626) BP: (100-109)/(42-43) 100/43 mmHg (07/17 0626) SpO2:  [98 %-100 %] 98 % (07/17 0626)  Intake/Output from previous day:   Intake/Output this shift:    No results found for this basename: HGB:5 in the last 72 hours No results found for this basename: WBC:2,RBC:2,HCT:2,PLT:2 in the last 72 hours No results found for this basename: NA:2,K:2,CL:2,CO2:2,BUN:2,CREATININE:2,GLUCOSE:2,CALCIUM:2 in the last 72 hours No results found for this basename: LABPT:2,INR:2 in the last 72 hours  Neurovascular intact Sensation intact distally Intact pulses distally Dorsiflexion/Plantar flexion intact Incision: dressing C/D/I  Assessment/Plan: 2 Days Post-Op Procedure(s) (LRB): MICRODISCECTOMY LUMBAR LAMINECTOMY (Bilateral) Up with therapy Discharge after PT today. rx on chart:  Percocet, robaxin, and ambien.  OTC stool softener daily OV 1 week with DR Ophelia Charter. COD stable   Jaiona Simien M 09/18/2011, 8:26 AM

## 2011-09-18 NOTE — Progress Notes (Signed)
Occupational Therapy Treatment Patient Details Name: Alyssa Sanders MRN: [id number] DOB: [birth year] Today's Date: 09/18/2011 Time: 4098-1191 OT Time Calculation (min): 28 min  OT Assessment / Plan / Recommendation Comments on Treatment Session Good carry over from earlier session. Feel that pt would benefit from [facility] to further teach back safety and increase independence with ADL and IADL tasks within the home. No further acute OT. All further OT to be addressed in [facility] setting.    Follow Up Recommendations  Home health OT    Barriers to Discharge  None    Equipment Recommendations  Rolling walker with 5" wheels;3 in 1 bedside comode;Other (comment)    Recommendations for Other Services    Frequency Min 2X/week   Plan Discharge plan remains appropriate    Precautions / Restrictions Precautions Precautions: Posterior Hip;Back Precaution Booklet Issued: Yes (comment) Restrictions Weight Bearing Restrictions: No   Pertinent Vitals/Pain 4    ADL  Eating/Feeding: Independent Grooming: Performed;Supervision/safety Where Assessed - Grooming: Supported standing Upper Body Bathing: Simulated;Set up Where Assessed - Upper Body Bathing: Supported sitting Lower Body Bathing: Performed;Minimal assistance Where Assessed - Lower Body Bathing: Supported sit to stand Upper Body Dressing: Simulated;Set up Where Assessed - Upper Body Dressing: Supported sitting Lower Body Dressing: Performed;Minimal assistance Where Assessed - Lower Body Dressing: Supported sit to stand Toilet Transfer: Research scientist (life sciences) Method: Sit to Barista: Materials engineer and Hygiene: Performed;Minimal assistance Where Assessed - Engineer, mining and Hygiene: Sit to stand from 3-in-1 or toilet Tub/Shower Transfer: Minimal assistance Tub/Shower Transfer Method: Science writer: Other  (comment) Equipment Used: Gait belt;Rolling walker Transfers/Ambulation Related to ADLs: supervision ADL Comments: Educated pt on availability and use of AE and DME for ADL. Pt with incrased independence @ over all S level with AE. Pt's husband present for treatment session. Given written infromation. Pt able to verbalize back precautions.     OT Diagnosis: Generalized weakness;Acute pain  OT Problem List: Decreased strength;Decreased range of motion;Decreased activity tolerance;Decreased knowledge of use of DME or AE;Decreased knowledge of precautions;Obesity;Pain OT Treatment Interventions: Self-care/ADL training;Energy conservation;DME and/or AE instruction;Therapeutic activities;Patient/family education   OT Goals Acute Rehab OT Goals OT Goal Formulation: With patient Time For Goal Achievement: 09/25/11 Potential to Achieve Goals: Good ADL Goals Pt Will Perform Lower Body Bathing: with caregiver independent in assisting;Sit to stand from chair;with adaptive equipment;with cueing (comment type and amount);with min assist ADL Goal: Lower Body Bathing - Progress: Met Pt Will Perform Lower Body Dressing: with min assist;with caregiver independent in assisting;Sit to stand from chair;Supported;with adaptive equipment;with cueing (comment type and amount) ADL Goal: Lower Body Dressing - Progress: Met Pt Will Transfer to Toilet: with modified independence;Anterior-posterior transfer;3-in-1;Maintaining back safety precautions;Maintaining hip precautions;with caregiver independent in assisting ADL Goal: Toilet Transfer - Progress: Progressing toward goals Pt Will Perform Toileting - Clothing Manipulation: with supervision;with caregiver independent in assisting;Standing;with adaptive equipment;with cueing (comment type and amount) ADL Goal: Toileting - Clothing Manipulation - Progress: Met Pt Will Perform Toileting - Hygiene: with supervision;with caregiver independent in assisting;with adaptive  equipment;with cueing (comment type and amount);Standing at 3-in-1/toilet ADL Goal: Toileting - Hygiene - Progress: Met Pt Will Perform Tub/Shower Transfer: with supervision;with caregiver independent in assisting;Ambulation;with DME;Maintaining hip precautions;Maintaining back safety precautions ADL Goal: Tub/Shower Transfer - Progress: Progressing toward goals Additional ADL Goal #1: Pt will state 3/3 back precautions independently ADL Goal: Additional Goal #1 - Progress: Met  Visit Information  Last OT Received On: 09/18/11 Assistance Needed: +  1    Subjective Data      Prior Functioning  Home Living Lives With: Family;Spouse;Son;Daughter Available Help at Discharge: Family Type of Home: House Home Access: Stairs to enter [employer] of Steps: 4 Entrance Stairs-Rails: Left Home Layout: Two level;Bed/bath upstairs Alternate Level Stairs-Number of Steps: 14 Alternate Level Stairs-Rails: Right;Left Bathroom Shower/Tub: Tub/shower unit;Door [employer] Toilet: Standard Bathroom Accessibility: No Home Adaptive Equipment: None Prior Function Level of Independence: Needs assistance Needs Assistance: Transfers Transfer Assistance: Min-mod assist from husband for supine to sit and sit to stand. Able to Take Stairs?: Yes Driving: No Vocation: Unemployed Communication Communication: No difficulties Dominant Hand: Right    Cognition  Overall Cognitive Status: Appears within functional limits for tasks assessed/performed Arousal/Alertness: Awake/alert Orientation Level: Appears intact for tasks assessed Behavior During Session: [facility] for tasks performed    Mobility Bed Mobility Bed Mobility: Rolling Left;Left Sidelying to Sit Rolling Left: 5: Supervision Left Sidelying to Sit: 5: Supervision Sit to Sidelying Left: 4: Min assist;HOB flat Details for Bed Mobility Assistance: Cues for technique, assist for bilateral LE management secondary to pain in Rt  hip. Transfers Transfers: Sit to Stand;Stand to Sit Sit to Stand: 5: Supervision;With upper extremity assist Stand to Sit: 5: Supervision;With upper extremity assist;To chair/3-in-1 Details for Transfer Assistance: Improved technique   Exercises    Balance Balance Balance Assessed: No  End of Session OT - End of Session Equipment Utilized During Treatment: Gait belt Activity Tolerance: Patient tolerated treatment well Patient left: in chair;with call bell/phone within reach;with family/visitor present  GO Functional Assessment Tool Used: clinical judgement Functional Limitation: Self care Self Care Current Status (Z6109): At least 60 percent but less than 80 percent impaired, limited or restricted Self Care Goal Status (U0454): At least 1 percent but less than 20 percent impaired, limited or restricted Self Care Discharge Status [id number]): At least 1 percent but less than 20 percent impaired, limited or restricted   Meer Reindl,HILLARY 09/18/2011, 1:04 PM [facility], OTR/L  [phone] 09/18/2011

## 2011-09-18 NOTE — Progress Notes (Signed)
Occupational Therapy Evaluation Patient Details Name: Alyssa Sanders MRN: [id number] DOB: [birth year] Today's Date: 09/18/2011 Time: 1130-1150 OT Time Calculation (min): 20 min  OT Assessment / Plan / Recommendation Clinical Impression  46 yo s/p L4 - 5 microdiscectomyand foraminectomy. Per MD orders, pt to follow back precautions and Posterior hip precautions. Pt will benefit from skilled OT services to max independence with ADl and functional mobility for ADL, secondary to deficits listed below, to facilitate D/C home with [facility] services. Rec HHOT, 3 in 1, youth RW, "hip kit", and HH shower.    OT Assessment  Patient needs continued OT Services    Follow Up Recommendations  Home health OT    Barriers to Discharge None    Equipment Recommendations  Rolling walker with 5" wheels;3 in 1 bedside comode;Other (comment) (HH shower, "hip kit")    Recommendations for Other Services    Frequency  Min 2X/week    Precautions / Restrictions Precautions Precautions: Posterior Hip;Back Precaution Booklet Issued: Yes (comment) Restrictions Weight Bearing Restrictions: No   Pertinent Vitals/Pain 4    ADL  Eating/Feeding: Independent Grooming: Performed;Supervision/safety Where Assessed - Grooming: Supported standing Upper Body Bathing: Simulated;Set up Where Assessed - Upper Body Bathing: Supported sitting Lower Body Bathing: Simulated;Maximal assistance Where Assessed - Lower Body Bathing: Supported sit to stand Upper Body Dressing: Simulated;Set up Where Assessed - Upper Body Dressing: Supported sitting Lower Body Dressing: Simulated;Maximal assistance Where Assessed - Lower Body Dressing: Supported sit to stand Toilet Transfer: Research scientist (life sciences) Method: Sit to Barista: Bedside commode;Other (comment) (over toilet) Toileting - Clothing Manipulation and Hygiene: Performed;Minimal assistance Where Assessed - Toileting Clothing  Manipulation and Hygiene: Sit to stand from 3-in-1 or toilet Tub/Shower Transfer: Minimal assistance Tub/Shower Transfer Method: Ambulating Tub/Shower Transfer Equipment: Other (comment) (3 in 1) Equipment Used: Gait belt;Rolling walker Transfers/Ambulation Related to ADLs: supervision ADL Comments: No knowledge of back or hip precautions or AE    OT Diagnosis: Generalized weakness;Acute pain  OT Problem List: Decreased strength;Decreased range of motion;Decreased activity tolerance;Decreased knowledge of use of DME or AE;Decreased knowledge of precautions;Obesity;Pain OT Treatment Interventions: Self-care/ADL training;Energy conservation;DME and/or AE instruction;Therapeutic activities;Patient/family education   OT Goals Acute Rehab OT Goals OT Goal Formulation: With patient Time For Goal Achievement: 09/25/11 Potential to Achieve Goals: Good ADL Goals Pt Will Perform Lower Body Bathing: with caregiver independent in assisting;Sit to stand from chair;with adaptive equipment;with cueing (comment type and amount);with min assist ADL Goal: Lower Body Bathing - Progress: Goal set today Pt Will Perform Lower Body Dressing: with min assist;with caregiver independent in assisting;Sit to stand from chair;Supported;with adaptive equipment;with cueing (comment type and amount) ADL Goal: Lower Body Dressing - Progress: Goal set today Pt Will Transfer to Toilet: with modified independence;Anterior-posterior transfer;3-in-1;Maintaining back safety precautions;Maintaining hip precautions;with caregiver independent in assisting ADL Goal: Toilet Transfer - Progress: Goal set today Pt Will Perform Toileting - Clothing Manipulation: with supervision;with caregiver independent in assisting;Standing;with adaptive equipment;with cueing (comment type and amount) ADL Goal: Toileting - Clothing Manipulation - Progress: Goal set today Pt Will Perform Toileting - Hygiene: with supervision;with caregiver independent  in assisting;with adaptive equipment;with cueing (comment type and amount);Standing at 3-in-1/toilet ADL Goal: Toileting - Hygiene - Progress: Goal set today Pt Will Perform Tub/Shower Transfer: with supervision;with caregiver independent in assisting;Ambulation;with DME;Maintaining hip precautions;Maintaining back safety precautions ADL Goal: Tub/Shower Transfer - Progress: Goal set today Additional ADL Goal #1: Pt will state 3/3 back precautions independently ADL Goal: Additional Goal #1 - Progress: Goal set  today  Visit Information  Last OT Received On: 09/18/11 Assistance Needed: +1    Subjective Data      Prior Functioning  Vision/Perception  Home Living Lives With: Family;Spouse;Son;Daughter Available Help at Discharge: Family Type of Home: House Home Access: Stairs to enter [employer] of Steps: 4 Entrance Stairs-Rails: Left Home Layout: Two level;Bed/bath upstairs Alternate Level Stairs-Number of Steps: 14 Alternate Level Stairs-Rails: Right;Left Bathroom Shower/Tub: Tub/shower unit;Door [employer] Toilet: Standard Bathroom Accessibility: No Home Adaptive Equipment: None Prior Function Level of Independence: Needs assistance Needs Assistance: Transfers Transfer Assistance: Min-mod assist from husband for supine to sit and sit to stand. Able to Take Stairs?: Yes Driving: No Vocation: Unemployed Communication Communication: No difficulties Dominant Hand: Right      Cognition  Overall Cognitive Status: Appears within functional limits for tasks assessed/performed Arousal/Alertness: Awake/alert Orientation Level: Appears intact for tasks assessed Behavior During Session: [facility] for tasks performed    Extremity/Trunk Assessment Right Upper Extremity Assessment RUE ROM/Strength/Tone: Within functional levels Left Upper Extremity Assessment LUE ROM/Strength/Tone: Within functional levels Right Lower Extremity Assessment RLE ROM/Strength/Tone: Deficits;Due  to pain RLE ROM/Strength/Tone Deficits: unable to tolerate weight bearing on R LE secondary to pain.   Left Lower Extremity Assessment LLE ROM/Strength/Tone: Within functional levels Trunk Assessment Trunk Assessment: Normal   Mobility Bed Mobility Bed Mobility: Rolling Left;Left Sidelying to Sit Rolling Left: 5: Supervision Left Sidelying to Sit: 5: Supervision Sit to Sidelying Left: 4: Min assist;HOB flat Details for Bed Mobility Assistance: Cues for technique, assist for bilateral LE management secondary to pain in Rt hip. Transfers Transfers: Sit to Stand;Stand to Sit Sit to Stand: 5: Supervision;With upper extremity assist;From bed Stand to Sit: 5: Supervision;To chair/3-in-1;With upper extremity assist Details for Transfer Assistance: vc for technique. More pain with RLE   Exercise    Balance Balance Balance Assessed: No  End of Session OT - End of Session Equipment Utilized During Treatment: Gait belt Activity Tolerance: Patient tolerated treatment well Patient left: in chair;with call bell/phone within reach;with family/visitor present  GO Functional Assessment Tool Used: clinical judgement Functional Limitation: Self care Self Care Current Status (N8295): At least 60 percent but less than 80 percent impaired, limited or restricted Self Care Goal Status (A2130): At least 1 percent but less than 20 percent impaired, limited or restricted   [facility] 09/18/2011, 12:57 PM [facility], OTR/L  [phone] 09/18/2011

## 2011-09-18 NOTE — Evaluation (Signed)
Physical Therapy Evaluation Patient Details Name: Alyssa Sanders MRN: [id number] DOB: 18-Jan-1966 Today's Date: 09/18/2011 Time: 5784-6962 PT Time Calculation (min): 39 min  PT Assessment / Plan / Recommendation Clinical Impression  Pt is a 46 y/o female with history of back and leg pain, s/p lumbar lamenectormy.   Pt and spouse educated in back and posterior hip precautions per MD orders.  Pt unable to tolerate wb on R LE.  Lives with spouse and has 14 steps to second floor.  Pt will benefit from HHPT consult to improve mobility and activity tolerance.      PT Assessment  All further PT needs can be met in the next venue of care    Follow Up Recommendations  Home health PT;Supervision - Intermittent    Barriers to Discharge None      Equipment Recommendations  Rolling walker with 5" wheels;3 in 1 bedside comode    Recommendations for Other Services     Frequency      Precautions / Restrictions Precautions Precautions: Posterior Hip;Back Precaution Booklet Issued: Yes (comment) Restrictions Weight Bearing Restrictions: No   Pertinent Vitals/Pain 6/10 pain in low back and posterior Right hip. Pt not yet due for pain meds.  Positioned pt in L side lying with pillows between knees for pain relief.        Mobility  Bed Mobility Bed Mobility: Rolling Left;Left Sidelying to Sit;Sit to Sidelying Left Rolling Left: 5: Supervision Left Sidelying to Sit: 4: Min assist;HOB flat Sit to Sidelying Left: 4: Min assist;HOB flat Details for Bed Mobility Assistance: Cues for technique, assist for bilateral LE management secondary to pain in Rt hip. Transfers Transfers: Sit to Stand;Stand to Sit;Stand Pivot Transfers Sit to Stand: 4: Min guard;5: Supervision;From chair/3-in-1;From bed;With upper extremity assist Stand to Sit: 4: Min guard;5: Supervision;To bed;To chair/3-in-1;With upper extremity assist Stand Pivot Transfers: 5: Supervision Details for Transfer Assistance: Verbal and  tactile cues for hand placement and slow controlled movements.   Pt  has a tendency to make fast  and sudden movements which increase pt's pain.   Ambulation/Gait Ambulation/Gait Assistance: 5: Supervision Ambulation Distance (Feet): 50 Feet Assistive device: Rolling walker Ambulation/Gait Assistance Details: Supervision for safety cues to increase weight bearing in R heel Gait Pattern: Step-to pattern;Decreased stance time - right;Antalgic Stairs: Yes Stairs Assistance: 4: Min assist Stairs Assistance Details (indicate cue type and reason): assist to stabilize pt via pt pushing on my hand with her R UE while using rail on left.  Instructed husband to properly assist pt.  Pt instructed to use RW and single rail.   Stair Management Technique: One rail Left;Step to pattern;Forwards;With walker;Other (comment) (with HHA) Number of Stairs: 2  Wheelchair Mobility Wheelchair Mobility: No    Exercises     PT Diagnosis: Difficulty walking;Acute pain;Generalized weakness  PT Problem List: Decreased activity tolerance;Decreased mobility;Decreased knowledge of use of DME;Decreased knowledge of precautions;Pain;Obesity;Decreased range of motion;Decreased strength PT Treatment Interventions: Gait training;Stair training;Therapeutic activities;Functional mobility training   PT Goals Acute Rehab PT Goals PT Goal Formulation: With patient  Visit Information  Last PT Received On: 09/18/11 Assistance Needed: +1    Subjective Data  Subjective: My buttocks hurts Patient Stated Goal: Be able to walk without pain   Prior Functioning  Home Living Lives With: Family;Spouse;Son;Daughter Available Help at Discharge: Family Type of Home: House Home Access: Stairs to enter [employer] of Steps: 4 Entrance Stairs-Rails: Left Home Layout: Two level;Bed/bath upstairs Alternate Level Stairs-Number of Steps: 14 Alternate Level Stairs-Rails:  Right;Left Bathroom Shower/Tub: Tub/shower  unit;Door [employer] Toilet: Standard Bathroom Accessibility: No Home Adaptive Equipment: None Prior Function Level of Independence: Needs assistance Needs Assistance: Transfers Transfer Assistance: Min-mod assist from husband for supine to sit and sit to stand. Able to Take Stairs?: Yes Driving: No Vocation: Unemployed Communication Communication: No difficulties Dominant Hand: Right    Cognition  Overall Cognitive Status: Appears within functional limits for tasks assessed/performed Arousal/Alertness: Awake/alert Orientation Level: Appears intact for tasks assessed Behavior During Session: [facility] for tasks performed    Extremity/Trunk Assessment Right Upper Extremity Assessment RUE ROM/Strength/Tone: Within functional levels Left Upper Extremity Assessment LUE ROM/Strength/Tone: Within functional levels Right Lower Extremity Assessment RLE ROM/Strength/Tone: Deficits;Due to pain RLE ROM/Strength/Tone Deficits: unable to tolerate weight bearing on R LE secondary to pain.   Left Lower Extremity Assessment LLE ROM/Strength/Tone: Within functional levels Trunk Assessment Trunk Assessment: Normal   Balance Balance Balance Assessed: No  End of Session PT - End of Session Equipment Utilized During Treatment: Gait belt Activity Tolerance: Patient tolerated treatment well;Patient limited by pain Patient left: in chair;with call bell/phone within reach;with family/visitor present Nurse Communication: Mobility status;Precautions;Weight bearing status  GP     Jabreel Chimento 09/18/2011, 11:13 AM  Theron Arista L. Bryona Foxworthy DPT [phone]

## 2011-09-18 NOTE — Care Management Note (Signed)
    Page 1 of 2   09/18/2011     2:57:16 PM   CARE MANAGEMENT NOTE 09/18/2011  Patient:  Alyssa Sanders, Alyssa Sanders   Account Number:  [id number]  Date Initiated:  09/18/2011  Documentation initiated by:  Anette Guarneri  Subjective/Objective Assessment:   POD#2 s/p lumbar lam     Action/Plan:   home with [facility] services  DME as needed   Anticipated DC Date:  09/18/2011   Anticipated DC Plan:  HOME W HOME HEALTH SERVICES      DC Planning Services  CM consult      [facility] Choice  DURABLE MEDICAL EQUIPMENT   Choice offered to / List presented to:  C-1 Patient   DME arranged  3-N-1  Levan Hurst      DME agency  [facility]        Status of service:  Completed, signed off Medicare Important Message given?  NO (If response is "NO", the following Medicare IM given date fields will be blank) Date Medicare IM given:   Date Additional Medicare IM given:    Discharge Disposition:  HOME W HOME HEALTH SERVICES  Per UR Regulation:  Reviewed for med. necessity/level of care/duration of stay  If discussed at Long Length of Stay Meetings, dates discussed:    Comments:  09/18/11  14:53 Anette Guarneri RN/CM contacted WC/CM Guy Franco @ ph# [phone] faxed facesheet/Op report/MD orders to [phone] Per Iona Hansen this CM may obtain RW/3n1 from [facility] per Iona Hansen they will arrange [facility] services/PT through [facility] faxed facesheet and MD orders for HHPT to [facility] @ [phone]

## 2011-09-20 NOTE — Discharge Summary (Signed)
Physician Discharge Summary  Patient ID: Alyssa Sanders MRN: [id number] DOB/AGE: [birth year] 46 y.o.  Admit date: 09/16/2011 Discharge date: 09/20/2011  Admission Diagnoses:  HNP (herniated nucleus pulposus), lumbar L4-5 central  Discharge Diagnoses:  Principal Problem:  *HNP (herniated nucleus pulposus), lumbar central L4-5  Past Medical History  Diagnosis Date  . Migraine     hx of  . Anemia     Surgeries: Procedure(s): MICRODISCECTOMY LUMBAR LAMINECTOMY on 09/16/2011   Consultants (if any):  none  Discharged Condition: Improved  Hospital Course: Alyssa Sanders is an 46 y.o. female who was admitted 09/16/2011 with a diagnosis of HNP (herniated nucleus pulposus), lumbar and went to the operating room on 09/16/2011 and underwent the above named procedures.    She was given perioperative antibiotics:  Anti-infectives     Start     Dose/Rate Route Frequency Ordered Stop   09/16/11 2000   ceFAZolin (ANCEF) IVPB 1 g/50 mL premix        1 g 100 mL/hr over 30 Minutes Intravenous Every 8 hours 09/16/11 1650 09/17/11 0515   09/15/11 1234   ceFAZolin (ANCEF) IVPB 2 g/50 mL premix        2 g 100 mL/hr over 30 Minutes Intravenous 60 min pre-op 09/15/11 1234 09/16/11 1225        Pt received PT and OT for ambulation and gait training as well as back care precautions. She was slow with progression due to poor pain management the first post op day.   Pt required IV narcotics and was eventually weaned to po narcotics with adequate pain control.  She was discharged with the appropriate DME for home  She was given sequential compression devices, early ambulation  She benefited maximally from the hospital stay and there were no complications.    Recent vital signs:  Filed Vitals:   09/18/11 1300  BP: 100/43  Pulse: 82  Temp: 98 F (36.7 C)  Resp: 16    Recent laboratory studies:  Lab Results  Component Value Date   HGB 11.4* 09/11/2011   Lab Results  Component Value  Date   WBC 7.3 09/11/2011   PLT 212 09/11/2011   No results found for this basename: INR   Lab Results  Component Value Date   NA 142 09/11/2011   K 4.0 09/11/2011   CL 104 09/11/2011   CO2 28 09/11/2011   BUN 11 09/11/2011   CREATININE 0.48* 09/11/2011   GLUCOSE 89 09/11/2011    Discharge Medications:   Medication List  As of 09/20/2011  1:33 PM   TAKE these medications         amitriptyline 100 MG tablet   Commonly known as: ELAVIL   Take 100 mg by mouth at bedtime.      HYDROcodone-acetaminophen 5-325 MG per tablet   Commonly known as: NORCO/VICODIN   Take 1 tablet by mouth every 6 (six) hours as needed.      methocarbamol 500 MG tablet   Commonly known as: ROBAXIN   Take 1 tablet (500 mg total) by mouth every 8 (eight) hours as needed (muscle spasm).      oxyCODONE-acetaminophen 5-325 MG per tablet   Commonly known as: PERCOCET/ROXICET   Take 1 tablet by mouth every 4 (four) hours as needed for pain.      oxyCODONE-acetaminophen 5-325 MG per tablet   Commonly known as: PERCOCET/ROXICET   Take 1-2 tablets by mouth every 6 (six) hours as needed for pain. May have every 4  to 6 hrs but try to minimize with max 10 tabs per day      traMADol 50 MG tablet   Commonly known as: ULTRAM   Take 50 mg by mouth every morning. Patient states she also takes tramadol 200 mg whenever she needs it.      traMADol 100 MG 24 hr tablet   Commonly known as: ULTRAM-ER   Take 200 mg by mouth daily as needed. For pain      VITAMIN B-12 IJ   Inject 1,000 Units as directed every 30 (thirty) days.      zolmitriptan 5 MG tablet   Commonly known as: ZOMIG   Take 5 mg by mouth as needed.      zolpidem 10 MG tablet   Commonly known as: AMBIEN   Take 1 tablet (10 mg total) by mouth at bedtime as needed for sleep.      zolpidem 10 MG tablet   Commonly known as: AMBIEN   Take 10 mg by mouth at bedtime as needed. For sleep            Diagnostic Studies: Dg Chest 2 View  09/11/2011   *RADIOLOGY REPORT*  Clinical Data: Preoperative assessment for lumbar microdiskectomy and laminectomy L4-L5  CHEST - 2 VIEW  Comparison: None  Findings: Normal heart size, mediastinal contours, and pulmonary vascularity. Lungs clear. No pleural effusion or pneumothorax. No acute osseous findings.  IMPRESSION: No acute abnormalities.  Original Report Authenticated By: Lollie Marrow, M.D.   Dg Lumbar Spine 2-3 Views  09/16/2011  *RADIOLOGY REPORT*  Clinical Data: L5-S1 surgery.  LUMBAR SPINE - 2-3 VIEW  Comparison: None.  Findings: Assuming there are five lumbar-type vertebral bodies, initial image shows a needle between the spinous processes of L3 and L4.  Second film shows a clamp overlying the superior spinous process of L4, directed at the pedicle level of L4.  IMPRESSION: Spinous process L4 localized.  Original Report Authenticated By: Thomasenia Sales, M.D.    Disposition: 01-Home or Self Care  Discharge Orders    Future Orders Please Complete By Expires   Diet - low sodium heart healthy      Call MD / Call 911      Comments:   If you experience chest pain or shortness of breath, CALL 911 and be transported to the hospital emergency room.  If you develope a fever above 101 F, pus (white drainage) or increased drainage or redness at the wound, or calf pain, call your surgeon's office.   Constipation Prevention      Comments:   Drink plenty of fluids.  Prune juice may be helpful.  You may use a stool softener, such as Colace (over the counter) 100 mg twice a day.  Use MiraLax (over the counter) for constipation as needed.   Increase activity slowly as tolerated      Discharge instructions      Comments:   Change dressing daily or as needed.  Keep wound covered.  Ice packs as needed.  May shower in 3 days if no drainage from wound.  Walk as tolerated. Avoid bending,lifting or twisting.   Lifting restrictions      Comments:   No lifting      Follow-up Information    Follow up with  YATES,MARK C, MD. Schedule an appointment as soon as possible for a visit in 1 week.   Contact information:   [facility] [address] [place] [address] [phone]  Signed: Wende Neighbors 09/20/2011, 1:33 PM

## 2012-01-08 ENCOUNTER — Other Ambulatory Visit (HOSPITAL_COMMUNITY): Payer: Self-pay | Admitting: Orthopaedic Surgery

## 2012-01-08 DIAGNOSIS — M549 Dorsalgia, unspecified: Secondary | ICD-10-CM

## 2012-01-09 ENCOUNTER — Emergency Department: Payer: Self-pay | Admitting: Unknown Physician Specialty

## 2012-01-09 LAB — CBC
HCT: 35.2 % (ref 35.0–47.0)
MCH: 28.9 pg (ref 26.0–34.0)
MCHC: 34.3 g/dL (ref 32.0–36.0)
MCV: 84 fL (ref 80–100)
Platelet: 196 10*3/uL (ref 150–440)
RDW: 15.8 % — ABNORMAL HIGH (ref 11.5–14.5)

## 2012-01-09 LAB — COMPREHENSIVE METABOLIC PANEL WITH GFR
Albumin: 3.8 g/dL
Alkaline Phosphatase: 134 U/L
Anion Gap: 8
BUN: 7 mg/dL
Bilirubin,Total: 0.6 mg/dL
Calcium, Total: 8.7 mg/dL
Chloride: 105 mmol/L
Co2: 28 mmol/L
Creatinine: 0.5 mg/dL — ABNORMAL LOW
EGFR (African American): >60
EGFR (Non-African Amer.): >60
Glucose: 77 mg/dL
Osmolality: 278
Potassium: 3.8 mmol/L
SGOT(AST): 18 U/L
SGPT (ALT): 22 U/L
Sodium: 141 mmol/L
Total Protein: 6.8 g/dL

## 2012-01-20 ENCOUNTER — Other Ambulatory Visit: Payer: Self-pay | Admitting: Radiology

## 2012-01-20 ENCOUNTER — Ambulatory Visit (HOSPITAL_COMMUNITY)
Admission: RE | Admit: 2012-01-20 | Discharge: 2012-01-20 | Disposition: A | Discharge status: Home / Self Care | Payer: Worker's Compensation | Source: Ambulatory Visit | Attending: Orthopaedic Surgery | Admitting: Orthopaedic Surgery

## 2012-01-20 DIAGNOSIS — M549 Dorsalgia, unspecified: Secondary | ICD-10-CM | POA: Insufficient documentation

## 2012-01-20 LAB — GLUCOSE, CAPILLARY: Glucose-Capillary: 94 mg/dL (ref 70–99)

## 2012-01-20 MED ORDER — IOHEXOL 180 MG/ML  SOLN
16.0000 mL | Freq: Once | INTRAMUSCULAR | Status: AC | PRN
  Administered 2012-01-20: 16 mL via INTRATHECAL

## 2012-01-20 MED ORDER — DIAZEPAM 5 MG PO TABS
ORAL_TABLET | ORAL | Status: AC
  Filled 2012-01-20: qty 2

## 2012-01-20 MED ORDER — ONDANSETRON HCL 4 MG/2ML IJ SOLN: 4.0000 mg | Freq: Four times a day (QID) | INTRAMUSCULAR | Status: DC | PRN

## 2012-01-20 MED ORDER — OXYCODONE-ACETAMINOPHEN 5-325 MG PO TABS
1.0000 | ORAL_TABLET | ORAL | Status: DC | PRN
Start: 2012-01-20 — End: 2012-01-21

## 2012-01-20 MED ORDER — HYDROCODONE-ACETAMINOPHEN 5-325 MG PO TABS
ORAL_TABLET | ORAL | Status: AC
  Filled 2012-01-20: qty 1

## 2012-01-20 MED ORDER — DIAZEPAM 5 MG PO TABS
10.0000 mg | ORAL_TABLET | Freq: Once | ORAL | Status: AC
  Administered 2012-01-20: 10 mg via ORAL

## 2012-01-20 MED ORDER — HYDROCODONE-ACETAMINOPHEN 5-325 MG PO TABS
1.0000 | ORAL_TABLET | Freq: Once | ORAL | Status: AC
  Administered 2012-01-20: 1 via ORAL

## 2012-01-20 NOTE — CV Procedure (Signed)
Lumbar myelogram performed at L23.  No complications.  Pt c/o headache.  Oxycodone given

## 2013-03-11 ENCOUNTER — Ambulatory Visit: Payer: Self-pay | Admitting: Family Medicine

## 2013-03-12 ENCOUNTER — Ambulatory Visit: Payer: Worker's Compensation | Admitting: Neurology

## 2014-01-31 ENCOUNTER — Inpatient Hospital Stay (HOSPITAL_COMMUNITY)
Admission: EM | Admit: 2014-01-31 | Discharge: 2014-02-07 | DRG: 493 | Disposition: A | Discharge status: Rehabilitation Facility | Payer: BC Managed Care – PPO | Attending: General Surgery | Admitting: General Surgery

## 2014-01-31 ENCOUNTER — Encounter (HOSPITAL_COMMUNITY): Payer: Self-pay | Admitting: *Deleted

## 2014-01-31 ENCOUNTER — Emergency Department: Payer: Self-pay | Admitting: Emergency Medicine

## 2014-01-31 DIAGNOSIS — E119 Type 2 diabetes mellitus without complications: Secondary | ICD-10-CM | POA: Diagnosis present

## 2014-01-31 DIAGNOSIS — R55 Syncope and collapse: Secondary | ICD-10-CM

## 2014-01-31 DIAGNOSIS — S82402A Unspecified fracture of shaft of left fibula, initial encounter for closed fracture: Secondary | ICD-10-CM | POA: Diagnosis present

## 2014-01-31 DIAGNOSIS — Z9884 Bariatric surgery status: Secondary | ICD-10-CM

## 2014-01-31 DIAGNOSIS — G8929 Other chronic pain: Secondary | ICD-10-CM | POA: Diagnosis present

## 2014-01-31 DIAGNOSIS — D62 Acute posthemorrhagic anemia: Secondary | ICD-10-CM | POA: Diagnosis present

## 2014-01-31 DIAGNOSIS — F22 Delusional disorders: Secondary | ICD-10-CM

## 2014-01-31 DIAGNOSIS — Z885 Allergy status to narcotic agent status: Secondary | ICD-10-CM

## 2014-01-31 DIAGNOSIS — S2249XA Multiple fractures of ribs, unspecified side, initial encounter for closed fracture: Secondary | ICD-10-CM

## 2014-01-31 DIAGNOSIS — I951 Orthostatic hypotension: Secondary | ICD-10-CM | POA: Diagnosis present

## 2014-01-31 DIAGNOSIS — S82209A Unspecified fracture of shaft of unspecified tibia, initial encounter for closed fracture: Secondary | ICD-10-CM

## 2014-01-31 DIAGNOSIS — G629 Polyneuropathy, unspecified: Secondary | ICD-10-CM | POA: Diagnosis present

## 2014-01-31 DIAGNOSIS — S82142A Displaced bicondylar fracture of left tibia, initial encounter for closed fracture: Principal | ICD-10-CM | POA: Diagnosis present

## 2014-01-31 DIAGNOSIS — S32039A Unspecified fracture of third lumbar vertebra, initial encounter for closed fracture: Secondary | ICD-10-CM | POA: Diagnosis present

## 2014-01-31 DIAGNOSIS — F418 Other specified anxiety disorders: Secondary | ICD-10-CM | POA: Diagnosis present

## 2014-01-31 DIAGNOSIS — S2241XA Multiple fractures of ribs, right side, initial encounter for closed fracture: Secondary | ICD-10-CM | POA: Diagnosis present

## 2014-01-31 DIAGNOSIS — G43909 Migraine, unspecified, not intractable, without status migrainosus: Secondary | ICD-10-CM | POA: Diagnosis present

## 2014-01-31 HISTORY — DX: Polyneuropathy, unspecified: G62.9

## 2014-01-31 HISTORY — DX: Dorsalgia, unspecified: M54.9

## 2014-01-31 LAB — CBC
HCT: 33.3 % — AB (ref 35.0–47.0)
HGB: 10.6 g/dL — AB (ref 12.0–16.0)
MCH: 23.4 pg — ABNORMAL LOW (ref 26.0–34.0)
MCHC: 31.8 g/dL — ABNORMAL LOW (ref 32.0–36.0)
MCV: 74 fL — ABNORMAL LOW (ref 80–100)
Platelet: 272 10*3/uL (ref 150–440)
RBC: 4.53 10*6/uL (ref 3.80–5.20)
RDW: 19.1 % — ABNORMAL HIGH (ref 11.5–14.5)
WBC: 13 10*3/uL — AB (ref 3.6–11.0)

## 2014-01-31 LAB — URINALYSIS, COMPLETE
BACTERIA: NONE SEEN
BILIRUBIN, UR: NEGATIVE
Blood: NEGATIVE
GLUCOSE, UR: NEGATIVE mg/dL (ref 0–75)
Ketone: NEGATIVE
NITRITE: NEGATIVE
Ph: 6 (ref 4.5–8.0)
RBC,UR: 4 /HPF (ref 0–5)
Specific Gravity: 1.02 (ref 1.003–1.030)
Squamous Epithelial: 1
WBC UR: 3 /HPF (ref 0–5)

## 2014-01-31 LAB — COMPREHENSIVE METABOLIC PANEL
ANION GAP: 9 (ref 7–16)
Albumin: 3.5 g/dL (ref 3.4–5.0)
Alkaline Phosphatase: 198 U/L — ABNORMAL HIGH
BUN: 12 mg/dL (ref 7–18)
Bilirubin,Total: 0.3 mg/dL (ref 0.2–1.0)
CO2: 27 mmol/L (ref 21–32)
Calcium, Total: 8.6 mg/dL (ref 8.5–10.1)
Chloride: 101 mmol/L (ref 98–107)
Creatinine: 0.68 mg/dL (ref 0.60–1.30)
EGFR (African American): >60
Glucose: 110 mg/dL — ABNORMAL HIGH (ref 65–99)
OSMOLALITY: 274 (ref 275–301)
Potassium: 4.1 mmol/L (ref 3.5–5.1)
SGOT(AST): 43 U/L — ABNORMAL HIGH (ref 15–37)
SGPT (ALT): 53 U/L
Sodium: 137 mmol/L (ref 136–145)
Total Protein: 7.5 g/dL (ref 6.4–8.2)

## 2014-01-31 LAB — CK TOTAL AND CKMB (NOT AT ARMC)
CK, TOTAL: 148 U/L (ref 26–192)
CK-MB: 2.2 ng/mL (ref 0.5–3.6)

## 2014-01-31 LAB — ETHANOL: Ethanol: 3 mg/dL

## 2014-01-31 LAB — TROPONIN I: Troponin-I: <0.02 ng/mL

## 2014-01-31 MED ORDER — BISACODYL 10 MG RE SUPP
10.0000 mg | Freq: Every day | RECTAL | Status: DC | PRN
Start: 2014-01-31 — End: 2014-02-07

## 2014-01-31 MED ORDER — HYDROMORPHONE HCL 1 MG/ML IJ SOLN
1.0000 mg | INTRAMUSCULAR | Status: DC | PRN
  Administered 2014-01-31: 1 mg via INTRAVENOUS
  Administered 2014-02-01 (×2): 2 mg via INTRAVENOUS
  Filled 2014-01-31 (×2): qty 2
  Filled 2014-01-31: qty 1

## 2014-01-31 MED ORDER — ENOXAPARIN SODIUM 40 MG/0.4ML ~~LOC~~ SOLN
40.0000 mg | SUBCUTANEOUS | Status: DC
  Administered 2014-01-31 – 2014-02-06 (×7): 40 mg via SUBCUTANEOUS
  Filled 2014-01-31 (×11): qty 0.4

## 2014-01-31 MED ORDER — LORAZEPAM 1 MG PO TABS
1.0000 mg | ORAL_TABLET | Freq: Two times a day (BID) | ORAL | Status: DC
  Administered 2014-01-31 – 2014-02-07 (×14): 1 mg via ORAL
  Filled 2014-01-31 (×14): qty 1

## 2014-01-31 MED ORDER — BUPROPION HCL ER (XL) 150 MG PO TB24
150.0000 mg | ORAL_TABLET | Freq: Every day | ORAL | Status: DC
  Administered 2014-02-01 – 2014-02-07 (×7): 150 mg via ORAL
  Filled 2014-01-31 (×11): qty 1

## 2014-01-31 MED ORDER — TRAMADOL HCL 50 MG PO TABS
50.0000 mg | ORAL_TABLET | Freq: Two times a day (BID) | ORAL | Status: DC | PRN
Start: 2014-01-31 — End: 2014-02-01
  Administered 2014-02-01: 50 mg via ORAL
  Filled 2014-01-31: qty 1

## 2014-01-31 MED ORDER — ONDANSETRON HCL 4 MG/2ML IJ SOLN
4.0000 mg | Freq: Four times a day (QID) | INTRAMUSCULAR | Status: DC | PRN
  Administered 2014-02-01: 4 mg via INTRAVENOUS
  Filled 2014-01-31: qty 2

## 2014-01-31 MED ORDER — HYDROCODONE-ACETAMINOPHEN 10-325 MG PO TABS
1.0000 | ORAL_TABLET | Freq: Four times a day (QID) | ORAL | Status: DC | PRN
  Administered 2014-01-31 – 2014-02-01 (×2): 1 via ORAL
  Filled 2014-01-31 (×2): qty 1

## 2014-01-31 MED ORDER — SODIUM CHLORIDE 0.9 % IV SOLN
INTRAVENOUS | Status: DC
  Administered 2014-01-31 – 2014-02-01 (×3): via INTRAVENOUS

## 2014-01-31 MED ORDER — DOCUSATE SODIUM 100 MG PO CAPS
100.0000 mg | ORAL_CAPSULE | Freq: Two times a day (BID) | ORAL | Status: DC
  Administered 2014-01-31 – 2014-02-07 (×14): 100 mg via ORAL
  Filled 2014-01-31 (×14): qty 1

## 2014-01-31 MED ORDER — ZOLPIDEM TARTRATE 5 MG PO TABS: 10.0000 mg | ORAL_TABLET | Freq: Every evening | ORAL | Status: DC | PRN

## 2014-01-31 MED ORDER — ZOLPIDEM TARTRATE 5 MG PO TABS
5.0000 mg | ORAL_TABLET | Freq: Every evening | ORAL | Status: DC | PRN
  Administered 2014-01-31 – 2014-02-06 (×6): 5 mg via ORAL
  Filled 2014-01-31 (×7): qty 1

## 2014-01-31 MED ORDER — PANTOPRAZOLE SODIUM 40 MG IV SOLR
40.0000 mg | Freq: Every day | INTRAVENOUS | Status: DC
  Filled 2014-01-31: qty 40

## 2014-01-31 MED ORDER — METHOCARBAMOL 500 MG PO TABS
500.0000 mg | ORAL_TABLET | Freq: Three times a day (TID) | ORAL | Status: DC | PRN
  Administered 2014-01-31 – 2014-02-06 (×13): 500 mg via ORAL
  Filled 2014-01-31 (×13): qty 1

## 2014-01-31 MED ORDER — AMITRIPTYLINE HCL 50 MG PO TABS
50.0000 mg | ORAL_TABLET | Freq: Every day | ORAL | Status: DC
  Administered 2014-02-01 – 2014-02-03 (×3): 50 mg via ORAL
  Filled 2014-01-31: qty 2
  Filled 2014-01-31 (×2): qty 1
  Filled 2014-01-31: qty 2
  Filled 2014-01-31 (×2): qty 1

## 2014-01-31 MED ORDER — NON FORMULARY: 5.0000 mg | Status: DC | PRN

## 2014-01-31 MED ORDER — ONDANSETRON HCL 4 MG PO TABS: 4.0000 mg | ORAL_TABLET | Freq: Four times a day (QID) | ORAL | Status: DC | PRN

## 2014-01-31 MED ORDER — PANTOPRAZOLE SODIUM 40 MG PO TBEC
40.0000 mg | DELAYED_RELEASE_TABLET | Freq: Every day | ORAL | Status: DC
  Administered 2014-01-31 – 2014-02-07 (×8): 40 mg via ORAL
  Filled 2014-01-31 (×8): qty 1

## 2014-01-31 NOTE — ED Notes (Signed)
MD at bedside. 

## 2014-01-31 NOTE — ED Notes (Signed)
Paged ortho/Blackman 
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
## 2014-01-31 NOTE — ED Notes (Signed)
Pt given water to drink per Dr.Wyatt; clear liquid diet ordered

## 2014-01-31 NOTE — ED Notes (Signed)
Clear liquids meal tray at bedside
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
## 2014-01-31 NOTE — ED Notes (Signed)
Paged neurosurgery/Jenkins
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
## 2014-01-31 NOTE — ED Notes (Signed)
Splint to L leg removed to Dr.Blackmon; C-collar removed earlier by Dr.Wyatt; Pharmacy contacted about pain medication

## 2014-01-31 NOTE — ED Notes (Signed)
Paged ortho/Blackman
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
## 2014-02-01 ENCOUNTER — Encounter (HOSPITAL_COMMUNITY): Payer: Self-pay | Admitting: Radiology

## 2014-02-01 ENCOUNTER — Observation Stay (HOSPITAL_COMMUNITY): Payer: BC Managed Care – PPO

## 2014-02-01 ENCOUNTER — Inpatient Hospital Stay (HOSPITAL_COMMUNITY): Payer: BC Managed Care – PPO

## 2014-02-01 DIAGNOSIS — S82402A Unspecified fracture of shaft of left fibula, initial encounter for closed fracture: Secondary | ICD-10-CM | POA: Diagnosis present

## 2014-02-01 DIAGNOSIS — S32039A Unspecified fracture of third lumbar vertebra, initial encounter for closed fracture: Secondary | ICD-10-CM | POA: Diagnosis present

## 2014-02-01 DIAGNOSIS — I951 Orthostatic hypotension: Secondary | ICD-10-CM | POA: Diagnosis present

## 2014-02-01 DIAGNOSIS — S2241XA Multiple fractures of ribs, right side, initial encounter for closed fracture: Secondary | ICD-10-CM | POA: Diagnosis present

## 2014-02-01 DIAGNOSIS — S32039S Unspecified fracture of third lumbar vertebra, sequela: Secondary | ICD-10-CM | POA: Diagnosis not present

## 2014-02-01 DIAGNOSIS — S82142S Displaced bicondylar fracture of left tibia, sequela: Secondary | ICD-10-CM | POA: Diagnosis not present

## 2014-02-01 DIAGNOSIS — S82142A Displaced bicondylar fracture of left tibia, initial encounter for closed fracture: Secondary | ICD-10-CM | POA: Diagnosis present

## 2014-02-01 DIAGNOSIS — G629 Polyneuropathy, unspecified: Secondary | ICD-10-CM | POA: Diagnosis present

## 2014-02-01 DIAGNOSIS — F419 Anxiety disorder, unspecified: Secondary | ICD-10-CM | POA: Insufficient documentation

## 2014-02-01 DIAGNOSIS — D62 Acute posthemorrhagic anemia: Secondary | ICD-10-CM | POA: Diagnosis present

## 2014-02-01 DIAGNOSIS — F32A Depression, unspecified: Secondary | ICD-10-CM | POA: Insufficient documentation

## 2014-02-01 DIAGNOSIS — G43909 Migraine, unspecified, not intractable, without status migrainosus: Secondary | ICD-10-CM | POA: Diagnosis present

## 2014-02-01 DIAGNOSIS — R55 Syncope and collapse: Secondary | ICD-10-CM | POA: Diagnosis present

## 2014-02-01 DIAGNOSIS — F329 Major depressive disorder, single episode, unspecified: Secondary | ICD-10-CM | POA: Insufficient documentation

## 2014-02-01 DIAGNOSIS — E119 Type 2 diabetes mellitus without complications: Secondary | ICD-10-CM | POA: Diagnosis present

## 2014-02-01 DIAGNOSIS — F418 Other specified anxiety disorders: Secondary | ICD-10-CM | POA: Diagnosis present

## 2014-02-01 DIAGNOSIS — G8929 Other chronic pain: Secondary | ICD-10-CM | POA: Diagnosis present

## 2014-02-01 DIAGNOSIS — R0989 Other specified symptoms and signs involving the circulatory and respiratory systems: Secondary | ICD-10-CM

## 2014-02-01 DIAGNOSIS — Z885 Allergy status to narcotic agent status: Secondary | ICD-10-CM | POA: Diagnosis not present

## 2014-02-01 DIAGNOSIS — Z9884 Bariatric surgery status: Secondary | ICD-10-CM | POA: Diagnosis not present

## 2014-02-01 LAB — COMPREHENSIVE METABOLIC PANEL
ALBUMIN: 3 g/dL — AB (ref 3.5–5.2)
ALT: 29 U/L (ref 0–35)
AST: 30 U/L (ref 0–37)
Alkaline Phosphatase: 147 U/L — ABNORMAL HIGH (ref 39–117)
Anion gap: 11 (ref 5–15)
BUN: 12 mg/dL (ref 6–23)
CHLORIDE: 97 meq/L (ref 96–112)
CO2: 26 mEq/L (ref 19–32)
CREATININE: 0.47 mg/dL — AB (ref 0.50–1.10)
Calcium: 8.3 mg/dL — ABNORMAL LOW (ref 8.4–10.5)
GFR calc Af Amer: >90 mL/min (ref >90)
GFR calc non Af Amer: >90 mL/min (ref >90)
Glucose, Bld: 118 mg/dL — ABNORMAL HIGH (ref 70–99)
Potassium: 5.3 mEq/L (ref 3.7–5.3)
Sodium: 134 mEq/L — ABNORMAL LOW (ref 137–147)
TOTAL PROTEIN: 6.1 g/dL (ref 6.0–8.3)
Total Bilirubin: 0.5 mg/dL (ref 0.3–1.2)

## 2014-02-01 LAB — CBC
HCT: 26.2 % — ABNORMAL LOW (ref 36.0–46.0)
Hemoglobin: 8.5 g/dL — ABNORMAL LOW (ref 12.0–15.0)
MCH: 23 pg — ABNORMAL LOW (ref 26.0–34.0)
MCHC: 32.4 g/dL (ref 30.0–36.0)
MCV: 71 fL — ABNORMAL LOW (ref 78.0–100.0)
PLATELETS: 196 10*3/uL (ref 150–400)
RBC: 3.69 MIL/uL — ABNORMAL LOW (ref 3.87–5.11)
RDW: 18 % — AB (ref 11.5–15.5)
WBC: 9.3 10*3/uL (ref 4.0–10.5)

## 2014-02-01 MED ORDER — PROMETHAZINE HCL 25 MG/ML IJ SOLN
12.5000 mg | Freq: Once | INTRAMUSCULAR | Status: AC
  Administered 2014-02-01: 12.5 mg via INTRAVENOUS
  Filled 2014-02-01: qty 1

## 2014-02-01 MED ORDER — TRAMADOL HCL 50 MG PO TABS
100.0000 mg | ORAL_TABLET | Freq: Four times a day (QID) | ORAL | Status: DC
  Administered 2014-02-01 – 2014-02-02 (×5): 100 mg via ORAL
  Filled 2014-02-01 (×5): qty 2

## 2014-02-01 MED ORDER — HYDROMORPHONE HCL 1 MG/ML IJ SOLN
1.0000 mg | INTRAMUSCULAR | Status: DC | PRN
  Administered 2014-02-01 – 2014-02-05 (×6): 1 mg via INTRAVENOUS
  Filled 2014-02-01 (×12): qty 1

## 2014-02-01 MED ORDER — SUMATRIPTAN SUCCINATE 50 MG PO TABS
50.0000 mg | ORAL_TABLET | ORAL | Status: DC | PRN
  Administered 2014-02-01 (×2): 50 mg via ORAL
  Filled 2014-02-01 (×4): qty 1

## 2014-02-01 MED ORDER — POLYETHYLENE GLYCOL 3350 17 G PO PACK
17.0000 g | PACK | Freq: Every day | ORAL | Status: DC
  Administered 2014-02-01 – 2014-02-07 (×6): 17 g via ORAL
  Filled 2014-02-01 (×9): qty 1

## 2014-02-01 MED ORDER — OXYCODONE HCL 5 MG PO TABS
10.0000 mg | ORAL_TABLET | ORAL | Status: DC | PRN
  Administered 2014-02-01 – 2014-02-07 (×26): 20 mg via ORAL
  Filled 2014-02-01 (×27): qty 4

## 2014-02-01 NOTE — Progress Notes (Signed)
Orthopedic Tech Progress Note Patient Details:  Alyssa LollDebbie A Sanders 05/04/1965 [id number] [employer] called to place brace orders Patient ID: Alyssa Sanders, female   DOB: 08/09/1965, 48 y.o.   MRN: [id number]   [name] 02/01/2014, 9:30 AM

## 2014-02-01 NOTE — Progress Notes (Signed)
Bilateral carotid artery duplex completed:  1-39% ICA stenosis.  Vertebral artery flow is antegrade.     
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
## 2014-02-01 NOTE — Progress Notes (Signed)
UR completed 

## 2014-02-02 ENCOUNTER — Inpatient Hospital Stay (HOSPITAL_COMMUNITY): Payer: BC Managed Care – PPO

## 2014-02-02 DIAGNOSIS — D509 Iron deficiency anemia, unspecified: Secondary | ICD-10-CM

## 2014-02-02 DIAGNOSIS — D62 Acute posthemorrhagic anemia: Secondary | ICD-10-CM

## 2014-02-02 LAB — CBC
HCT: 26.1 % — ABNORMAL LOW (ref 36.0–46.0)
Hemoglobin: 8.4 g/dL — ABNORMAL LOW (ref 12.0–15.0)
MCH: 23 pg — ABNORMAL LOW (ref 26.0–34.0)
MCHC: 32.2 g/dL (ref 30.0–36.0)
MCV: 71.3 fL — ABNORMAL LOW (ref 78.0–100.0)
Platelets: 200 10*3/uL (ref 150–400)
RBC: 3.66 MIL/uL — ABNORMAL LOW (ref 3.87–5.11)
RDW: 17.9 % — ABNORMAL HIGH (ref 11.5–15.5)
WBC: 12.7 10*3/uL — ABNORMAL HIGH (ref 4.0–10.5)

## 2014-02-02 LAB — IRON AND TIBC
IRON: 16 ug/dL — AB (ref 42–135)
Saturation Ratios: 4 % — ABNORMAL LOW (ref 20–55)
TIBC: 391 ug/dL (ref 250–470)
UIBC: 375 ug/dL (ref 125–400)

## 2014-02-02 LAB — FERRITIN: FERRITIN: 55 ng/mL (ref 10–291)

## 2014-02-02 MED ORDER — ZOLMITRIPTAN 2.5 MG PO TABS
5.0000 mg | ORAL_TABLET | ORAL | Status: DC | PRN
  Filled 2014-02-02: qty 2
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
## 2014-02-02 NOTE — Progress Notes (Signed)
Patient ID: Alyssa Sanders, female   DOB: 09/24/1965, 48 y.o.   MRN: [id number] No acute changes overnight.  Left leg compartments soft.  Bledsoe brace in place.  Left foot well perfused with normal sensation.  She is able to dorsiflex her foot with minimal discomfort.  I have spoken with her about the plan to proceed to surgery this Friday for ORIF of her left tibial plateau.

## 2014-02-02 NOTE — Progress Notes (Signed)
Rehab Admissions Coordinator Note:  Patient was screened by Nasya Vincent L for appropriateness for an Inpatient Acute Rehab Consult.  At this time, we are recommending Inpatient Rehab consult.  Juelz Whittenberg L 02/02/2014, 10:36 AM  I can be reached at [phone].
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
## 2014-02-02 NOTE — Procedures (Signed)
History: 48 year old female with a history of syncope of unclear etiology  Sedation: None  Technique: This is a 17 channel routine scalp EEG performed at the bedside with bipolar and monopolar montages arranged in accordance to the international 10/20 system of electrode placement. One channel was dedicated to EKG recording.    Background: There is a well defined posterior dominant rhythm of 8.5 Hz that attenuates with eye opening. The background consists of intermixed alpha and beta activities. There is some slow activity associated with drowsiness and sleep structures are recorded briefly.  Photic stimulation: Physiologic driving is present  EEG Abnormalities: None  Clinical Interpretation: This normal EEG is recorded in the waking and sleep state. There was no seizure or seizure predisposition recorded on this study. Please note that a normal EEG does not exclude the possibility of a diagnosis of epilepsy.  Ritta SlotMcNeill Kirkpatrick, MD [facility] [phone]  If 7pm- 7am, please page neurology on call as listed in AMION.

## 2014-02-02 NOTE — Progress Notes (Signed)
OT Cancellation Note  Patient Details Name: Alyssa Sanders MRN: [id number] DOB: 04/03/1965   Cancelled Treatment:    Reason Eval/Treat Not Completed: Other (comment). Pt plans to have ORIF surgery on Friday. Will evaluate after surgery.   Earlie RavelingStraub, Liban Guedes L OTR/L [phone] 02/02/2014, 1:39 PM
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
## 2014-02-02 NOTE — Progress Notes (Signed)
EEG Completed; Results Pending  

## 2014-02-03 DIAGNOSIS — G43909 Migraine, unspecified, not intractable, without status migrainosus: Secondary | ICD-10-CM | POA: Diagnosis present

## 2014-02-03 DIAGNOSIS — R55 Syncope and collapse: Secondary | ICD-10-CM | POA: Diagnosis present

## 2014-02-03 MED ORDER — TRAMADOL HCL 50 MG PO TABS
100.0000 mg | ORAL_TABLET | Freq: Four times a day (QID) | ORAL | Status: DC
  Administered 2014-02-03 – 2014-02-07 (×16): 100 mg via ORAL
  Filled 2014-02-03 (×16): qty 2

## 2014-02-03 NOTE — Progress Notes (Signed)
OT NOTE  OT holding evaluation pending surgery at this time. Spoke directly with CIR Janine and reports OT eval is not needed until after surgery. OT to evaluation at this time to assist with CIR admission.   Mateo FlowJones, Brynn   OTR/L Pager: [phone] Office: [phone] .
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
## 2014-02-03 NOTE — Progress Notes (Signed)
  Echocardiogram 2D Echocardiogram has been performed.  Ashe Graybeal 02/03/2014, 8:50 AM
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
## 2014-02-03 NOTE — Progress Notes (Signed)
Patient ID: Alyssa Sanders, female   DOB: 03/07/1965, 48 y.o.   MRN: [id number] ORIF of left proximal tibia fracture planned for tomorrow afternoon.  Will make NPO after midnight and discuss surgery in more detail with her and her husband later today.

## 2014-02-04 ENCOUNTER — Inpatient Hospital Stay (HOSPITAL_COMMUNITY): Payer: BC Managed Care – PPO | Admitting: Anesthesiology

## 2014-02-04 ENCOUNTER — Encounter (HOSPITAL_COMMUNITY): Admission: EM | Disposition: A | Discharge status: Rehabilitation Facility | Payer: Self-pay | Source: Home / Self Care

## 2014-02-04 ENCOUNTER — Encounter (HOSPITAL_COMMUNITY): Payer: Self-pay | Admitting: Certified Registered"

## 2014-02-04 ENCOUNTER — Inpatient Hospital Stay (HOSPITAL_COMMUNITY): Payer: BC Managed Care – PPO

## 2014-02-04 DIAGNOSIS — S32039S Unspecified fracture of third lumbar vertebra, sequela: Secondary | ICD-10-CM

## 2014-02-04 DIAGNOSIS — S82142S Displaced bicondylar fracture of left tibia, sequela: Secondary | ICD-10-CM

## 2014-02-04 HISTORY — PX: ORIF TIBIA PLATEAU: SHX2132

## 2014-02-04 LAB — GLUCOSE, CAPILLARY
Glucose-Capillary: 76 mg/dL (ref 70–99)
Glucose-Capillary: 127 mg/dL — ABNORMAL HIGH (ref 70–99)

## 2014-02-04 LAB — SURGICAL PCR SCREEN
MRSA, PCR: NEGATIVE
Staphylococcus aureus: NEGATIVE

## 2014-02-04 SURGERY — OPEN REDUCTION INTERNAL FIXATION (ORIF) TIBIAL PLATEAU
Anesthesia: General | Site: Leg Lower | Laterality: Left

## 2014-02-04 MED ORDER — MIDAZOLAM HCL 2 MG/2ML IJ SOLN
INTRAMUSCULAR | Status: AC
Start: 2014-02-04 — End: 2014-02-05
  Filled 2014-02-04: qty 2

## 2014-02-04 MED ORDER — 0.9 % SODIUM CHLORIDE (POUR BTL) OPTIME
TOPICAL | Status: DC | PRN
  Administered 2014-02-04: 1000 mL

## 2014-02-04 MED ORDER — GLYCOPYRROLATE 0.2 MG/ML IJ SOLN
INTRAMUSCULAR | Status: DC | PRN
  Administered 2014-02-04: 0.3 mg via INTRAVENOUS

## 2014-02-04 MED ORDER — ONDANSETRON HCL 4 MG/2ML IJ SOLN
INTRAMUSCULAR | Status: AC
  Filled 2014-02-04: qty 2

## 2014-02-04 MED ORDER — OXYCODONE HCL 5 MG PO TABS
ORAL_TABLET | ORAL | Status: AC
  Administered 2014-02-05: 20 mg via ORAL
  Filled 2014-02-04: qty 4

## 2014-02-04 MED ORDER — LACTATED RINGERS IV SOLN
INTRAVENOUS | Status: DC | PRN
Start: 2014-02-04 — End: 2014-02-04
  Administered 2014-02-04 (×2): via INTRAVENOUS

## 2014-02-04 MED ORDER — MIDAZOLAM HCL 2 MG/2ML IJ SOLN
1.0000 mg | Freq: Once | INTRAMUSCULAR | Status: AC
  Administered 2014-02-04: 2 mg via INTRAVENOUS

## 2014-02-04 MED ORDER — PROPOFOL 10 MG/ML IV BOLUS
INTRAVENOUS | Status: AC
  Filled 2014-02-04: qty 20

## 2014-02-04 MED ORDER — SUCCINYLCHOLINE CHLORIDE 20 MG/ML IJ SOLN
INTRAMUSCULAR | Status: AC
  Filled 2014-02-04: qty 1

## 2014-02-04 MED ORDER — PHENYLEPHRINE 40 MCG/ML (10ML) SYRINGE FOR IV PUSH (FOR BLOOD PRESSURE SUPPORT)
PREFILLED_SYRINGE | INTRAVENOUS | Status: AC
  Filled 2014-02-04: qty 10

## 2014-02-04 MED ORDER — MIDAZOLAM HCL 5 MG/5ML IJ SOLN
INTRAMUSCULAR | Status: DC | PRN
  Administered 2014-02-04: 2 mg via INTRAVENOUS

## 2014-02-04 MED ORDER — PROMETHAZINE HCL 25 MG/ML IJ SOLN
INTRAMUSCULAR | Status: AC
  Filled 2014-02-04: qty 1

## 2014-02-04 MED ORDER — HYDROMORPHONE HCL 1 MG/ML IJ SOLN
0.2500 mg | INTRAMUSCULAR | Status: DC | PRN
  Administered 2014-02-04 (×4): 0.5 mg via INTRAVENOUS

## 2014-02-04 MED ORDER — PROPOFOL 10 MG/ML IV BOLUS
INTRAVENOUS | Status: DC | PRN
  Administered 2014-02-04: 100 mg via INTRAVENOUS
  Administered 2014-02-04: 40 mg via INTRAVENOUS

## 2014-02-04 MED ORDER — PHENYLEPHRINE HCL 10 MG/ML IJ SOLN
INTRAMUSCULAR | Status: DC | PRN
  Administered 2014-02-04: 80 ug via INTRAVENOUS
  Administered 2014-02-04: 40 ug via INTRAVENOUS
  Administered 2014-02-04 (×2): 80 ug via INTRAVENOUS
  Administered 2014-02-04: 120 ug via INTRAVENOUS

## 2014-02-04 MED ORDER — METHOCARBAMOL 500 MG PO TABS
ORAL_TABLET | ORAL | Status: AC
  Administered 2014-02-05: 500 mg via ORAL
  Filled 2014-02-04: qty 1

## 2014-02-04 MED ORDER — ARTIFICIAL TEARS OP OINT
TOPICAL_OINTMENT | OPHTHALMIC | Status: DC | PRN
  Administered 2014-02-04: 1 via OPHTHALMIC

## 2014-02-04 MED ORDER — FENTANYL CITRATE 0.05 MG/ML IJ SOLN
INTRAMUSCULAR | Status: AC
  Filled 2014-02-04: qty 5

## 2014-02-04 MED ORDER — ARTIFICIAL TEARS OP OINT
TOPICAL_OINTMENT | OPHTHALMIC | Status: AC
  Filled 2014-02-04: qty 3.5

## 2014-02-04 MED ORDER — CEFAZOLIN SODIUM-DEXTROSE 2-3 GM-% IV SOLR
2.0000 g | Freq: Once | INTRAVENOUS | Status: AC
  Administered 2014-02-04: 2 g via INTRAVENOUS

## 2014-02-04 MED ORDER — ROCURONIUM BROMIDE 50 MG/5ML IV SOLN
INTRAVENOUS | Status: AC
  Filled 2014-02-04: qty 1

## 2014-02-04 MED ORDER — LACTATED RINGERS IV SOLN
INTRAVENOUS | Status: DC
  Administered 2014-02-04: 15:00:00 via INTRAVENOUS

## 2014-02-04 MED ORDER — MIDAZOLAM HCL 2 MG/2ML IJ SOLN
INTRAMUSCULAR | Status: AC
  Filled 2014-02-04: qty 2

## 2014-02-04 MED ORDER — CEFAZOLIN SODIUM-DEXTROSE 2-3 GM-% IV SOLR
INTRAVENOUS | Status: AC
  Filled 2014-02-04: qty 50

## 2014-02-04 MED ORDER — CEFAZOLIN SODIUM 1-5 GM-% IV SOLN
1.0000 g | Freq: Three times a day (TID) | INTRAVENOUS | Status: AC
  Administered 2014-02-04 – 2014-02-05 (×3): 1 g via INTRAVENOUS
  Filled 2014-02-04 (×4): qty 50

## 2014-02-04 MED ORDER — LIDOCAINE HCL (CARDIAC) 20 MG/ML IV SOLN
INTRAVENOUS | Status: AC
  Filled 2014-02-04: qty 5

## 2014-02-04 MED ORDER — AMITRIPTYLINE HCL 25 MG PO TABS
25.0000 mg | ORAL_TABLET | Freq: Every day | ORAL | Status: DC
  Administered 2014-02-04 – 2014-02-06 (×3): 25 mg via ORAL
  Filled 2014-02-04 (×5): qty 1

## 2014-02-04 MED ORDER — GLYCOPYRROLATE 0.2 MG/ML IJ SOLN
INTRAMUSCULAR | Status: AC
  Filled 2014-02-04: qty 2

## 2014-02-04 MED ORDER — ONDANSETRON HCL 4 MG/2ML IJ SOLN
INTRAMUSCULAR | Status: DC | PRN
  Administered 2014-02-04: 4 mg via INTRAVENOUS

## 2014-02-04 MED ORDER — ROCURONIUM BROMIDE 100 MG/10ML IV SOLN
INTRAVENOUS | Status: DC | PRN
  Administered 2014-02-04: 50 mg via INTRAVENOUS

## 2014-02-04 MED ORDER — FENTANYL CITRATE 0.05 MG/ML IJ SOLN
INTRAMUSCULAR | Status: DC | PRN
  Administered 2014-02-04 (×5): 50 ug via INTRAVENOUS
  Administered 2014-02-04: 100 ug via INTRAVENOUS
  Administered 2014-02-04: 50 ug via INTRAVENOUS
  Administered 2014-02-04: 100 ug via INTRAVENOUS

## 2014-02-04 MED ORDER — PROMETHAZINE HCL 25 MG/ML IJ SOLN: 6.2500 mg | INTRAMUSCULAR | Status: DC | PRN

## 2014-02-04 MED ORDER — HYDROMORPHONE HCL 1 MG/ML IJ SOLN
INTRAMUSCULAR | Status: AC
  Administered 2014-02-05: 1 mg via INTRAVENOUS
  Filled 2014-02-04: qty 1

## 2014-02-04 MED ORDER — NEOSTIGMINE METHYLSULFATE 10 MG/10ML IV SOLN
INTRAVENOUS | Status: DC | PRN
  Administered 2014-02-04: 2 mg via INTRAVENOUS

## 2014-02-04 SURGICAL SUPPLY — 78 items
BANDAGE ELASTIC 4 VELCRO ST LF (GAUZE/BANDAGES/DRESSINGS) IMPLANT
BANDAGE ELASTIC 6 VELCRO ST LF (GAUZE/BANDAGES/DRESSINGS) ×3 IMPLANT
BANDAGE ESMARK 6X9 LF (GAUZE/BANDAGES/DRESSINGS) ×1 IMPLANT
BIT DRILL CALIBR QC 2.5X250 (BIT) ×3 IMPLANT
BIT DRILL PERC QC 2.8X200 100 (BIT) ×1 IMPLANT
BNDG COHESIVE 4X5 TAN STRL (GAUZE/BANDAGES/DRESSINGS) ×3 IMPLANT
BNDG ESMARK 6X9 LF (GAUZE/BANDAGES/DRESSINGS) ×3
BONE CANC CHIPS 20CC PCAN1/4 (Bone Implant) ×3 IMPLANT
CHIPS CANC BONE 20CC PCAN1/4 (Bone Implant) ×1 IMPLANT
CLEANER TIP ELECTROSURG 2X2 (MISCELLANEOUS) ×3 IMPLANT
COVER MAYO STAND STRL (DRAPES) ×3 IMPLANT
COVER SURGICAL LIGHT HANDLE (MISCELLANEOUS) ×3 IMPLANT
CUFF TOURNIQUET SINGLE 34IN LL (TOURNIQUET CUFF) ×3 IMPLANT
CUFF TOURNIQUET SINGLE 44IN (TOURNIQUET CUFF) IMPLANT
DRAPE C-ARM 42X72 X-RAY (DRAPES) ×3 IMPLANT
DRAPE ORTHO SPLIT 77X108 STRL (DRAPES) ×4
DRAPE SURG ORHT 6 SPLT 77X108 (DRAPES) ×2 IMPLANT
DRAPE U-SHAPE 47X51 STRL (DRAPES) ×3 IMPLANT
DRILL BIT QUICK COUP 2.8MM 100 (BIT) ×2
DRSG PAD ABDOMINAL 8X10 ST (GAUZE/BANDAGES/DRESSINGS) ×3 IMPLANT
DURAPREP 26ML APPLICATOR (WOUND CARE) ×3 IMPLANT
ELECT REM PT RETURN 9FT ADLT (ELECTROSURGICAL) ×3
ELECTRODE REM PT RTRN 9FT ADLT (ELECTROSURGICAL) ×1 IMPLANT
EVACUATOR 1/8 PVC DRAIN (DRAIN) IMPLANT
GAUZE SPONGE 4X4 12PLY STRL (GAUZE/BANDAGES/DRESSINGS) ×3 IMPLANT
GAUZE XEROFORM 1X8 LF (GAUZE/BANDAGES/DRESSINGS) ×3 IMPLANT
GLOVE BIO SURGEON STRL SZ8 (GLOVE) ×3 IMPLANT
GLOVE BIOGEL PI IND STRL 6.5 (GLOVE) ×1 IMPLANT
GLOVE BIOGEL PI IND STRL 7.0 (GLOVE) ×2 IMPLANT
GLOVE BIOGEL PI IND STRL 8 (GLOVE) ×1 IMPLANT
GLOVE BIOGEL PI INDICATOR 6.5 (GLOVE) ×2
GLOVE BIOGEL PI INDICATOR 7.0 (GLOVE) ×4
GLOVE BIOGEL PI INDICATOR 8 (GLOVE) ×2
GLOVE ECLIPSE 6.5 STRL STRAW (GLOVE) ×3 IMPLANT
GLOVE ORTHO TXT STRL SZ7.5 (GLOVE) ×3 IMPLANT
GLOVE SURG SS PI 7.0 STRL IVOR (GLOVE) ×3 IMPLANT
GOWN STRL REUS W/ TWL LRG LVL3 (GOWN DISPOSABLE) ×2 IMPLANT
GOWN STRL REUS W/ TWL XL LVL3 (GOWN DISPOSABLE) ×1 IMPLANT
GOWN STRL REUS W/TWL LRG LVL3 (GOWN DISPOSABLE) ×4
GOWN STRL REUS W/TWL XL LVL3 (GOWN DISPOSABLE) ×2
IMMOBILIZER KNEE 22 UNIV (SOFTGOODS) IMPLANT
KIT BASIN OR (CUSTOM PROCEDURE TRAY) ×3 IMPLANT
KIT ROOM TURNOVER OR (KITS) ×3 IMPLANT
NS IRRIG 1000ML POUR BTL (IV SOLUTION) ×3 IMPLANT
PACK ORTHO EXTREMITY (CUSTOM PROCEDURE TRAY) ×3 IMPLANT
PAD ARMBOARD 7.5X6 YLW CONV (MISCELLANEOUS) ×6 IMPLANT
PAD CAST 4YDX4 CTTN HI CHSV (CAST SUPPLIES) IMPLANT
PADDING CAST COTTON 4X4 STRL (CAST SUPPLIES)
PADDING CAST COTTON 6X4 STRL (CAST SUPPLIES) ×3 IMPLANT
PENCIL BUTTON HOLSTER BLD 10FT (ELECTRODE) ×3 IMPLANT
PLATE PROX TIBIA 6H LT 3.5 VA (Plate) ×3 IMPLANT
SCREW 3.5X60 (Screw) ×3 IMPLANT
SCREW CORTEX 3.5 34MM (Screw) ×2 IMPLANT
SCREW CORTEX 3.5 38MM (Screw) ×2 IMPLANT
SCREW LOCK CORT ST 3.5X34 (Screw) ×1 IMPLANT
SCREW LOCK CORT ST 3.5X38 (Screw) ×1 IMPLANT
SCREW LOCKING VA 3.5X32MM (Screw) ×6 IMPLANT
SCREW LOCKING VA 3.5X75MM (Screw) ×9 IMPLANT
SCREW PELVIC CORT ST 3.5X75 (Screw) ×1 IMPLANT
SCREW SELF TAP 3.5 75M (Screw) ×2 IMPLANT
SPONGE LAP 18X18 X RAY DECT (DISPOSABLE) ×3 IMPLANT
STAPLER VISISTAT 35W (STAPLE) ×3 IMPLANT
STOCKINETTE IMPERVIOUS 9X36 MD (GAUZE/BANDAGES/DRESSINGS) ×3 IMPLANT
STOCKINETTE IMPERVIOUS LG (DRAPES) IMPLANT
SUCTION FRAZIER TIP 10 FR DISP (SUCTIONS) ×3 IMPLANT
SUT ETHILON 2 0 FS 18 (SUTURE) ×3 IMPLANT
SUT VIC AB 0 CT1 27 (SUTURE) ×4
SUT VIC AB 0 CT1 27XBRD ANBCTR (SUTURE) ×2 IMPLANT
SUT VIC AB 2-0 CT1 27 (SUTURE) ×4
SUT VIC AB 2-0 CT1 TAPERPNT 27 (SUTURE) ×2 IMPLANT
SYR BULB IRRIGATION 50ML (SYRINGE) ×3 IMPLANT
TOWEL OR 17X24 6PK STRL BLUE (TOWEL DISPOSABLE) ×3 IMPLANT
TOWEL OR 17X26 10 PK STRL BLUE (TOWEL DISPOSABLE) ×3 IMPLANT
TUBE CONNECTING 12'X1/4 (SUCTIONS) ×1
TUBE CONNECTING 12X1/4 (SUCTIONS) ×2 IMPLANT
UNDERPAD 30X30 INCONTINENT (UNDERPADS AND DIAPERS) ×3 IMPLANT
WATER STERILE IRR 1000ML POUR (IV SOLUTION) ×3 IMPLANT
YANKAUER SUCT BULB TIP NO VENT (SUCTIONS) ×3 IMPLANT

## 2014-02-04 NOTE — Anesthesia Preprocedure Evaluation (Addendum)
Anesthesia Evaluation  Patient identified by MRN, date of birth, ID band Patient awake    Reviewed: Allergy & Precautions, H&P , NPO status , Patient's Chart, lab work & pertinent test results  History of Anesthesia Complications Negative for: history of anesthetic complications  Airway Mallampati: II  TM Distance: >3 FB Neck ROM: Full    Dental  (+) Teeth Intact, Missing, Dental Advisory Given   Pulmonary neg pulmonary ROS,    Pulmonary exam normal       Cardiovascular negative cardio ROS  Rhythm:Regular Rate:Normal  Study Conclusions  - Left ventricle: The cavity size was normal. Wall thickness was increased in a pattern of mild LVH. Systolic function was normal. The estimated ejection fraction was in the range of 60% to 65%. Wall motion was normal; there were no regional wall motion abnormalities.    Neuro/Psych PSYCHIATRIC DISORDERS Anxiety Depression ? syncopal episode unknown etiology NL EEG, Echo, Carotid doppler    GI/Hepatic negative GI ROS, Neg liver ROS,   Endo/Other  negative endocrine ROS  Renal/GU negative Renal ROS     Musculoskeletal   Abdominal   Peds  Hematology   Anesthesia Other Findings   Reproductive/Obstetrics                           Anesthesia Physical Anesthesia Plan  ASA: III  Anesthesia Plan: General   Post-op Pain Management:    Induction: Intravenous  Airway Management Planned: Oral ETT  Additional Equipment:   Intra-op Plan:   Post-operative Plan: Extubation in OR  Informed Consent: I have reviewed the patients History and Physical, chart, labs and discussed the procedure including the risks, benefits and alternatives for the proposed anesthesia with the patient or authorized representative who has indicated his/her understanding and acceptance.   Dental advisory given  Plan Discussed with: CRNA, Anesthesiologist and  Surgeon  Anesthesia Plan Comments:         Anesthesia Quick Evaluation

## 2014-02-04 NOTE — Brief Op Note (Signed)
01/31/2014 - 02/04/2014  5:45 PM  PATIENT:  Alyssa Sanders  48 y.o. female  PRE-OPERATIVE DIAGNOSIS:  Left bicondylar tibial plateau fracture  POST-OPERATIVE DIAGNOSIS:  Left bicondylar tibial plateau fracture  PROCEDURE:  Procedure(s): OPEN REDUCTION INTERNAL FIXATION (ORIF) LEFT BICONDYLAR TIBIAL PLATEAU FRACTURE (Left)  SURGEON:  Surgeon(s) and Role:    * Kathryne Hitchhristopher Y Usher Hedberg, MD - Primary  PHYSICIAN ASSISTANT: Rexene EdisonGil Clark, PA-C  ANESTHESIA:   general  EBL:  Total I/O In: 1000 [I.V.:1000] Out: 250 [Urine:200; Blood:50]  BLOOD ADMINISTERED:none  DRAINS: none   LOCAL MEDICATIONS USED:  NONE  SPECIMEN:  No Specimen  DISPOSITION OF SPECIMEN:  N/A  COUNTS:  YES  TOURNIQUET:   Total Tourniquet Time Documented: Thigh (Left) - 88 minutes Total: Thigh (Left) - 88 minutes   DICTATION: .Other Dictation: Dictation Number [id number]  PLAN OF CARE: Admit to inpatient   PATIENT DISPOSITION:  PACU - hemodynamically stable.   Delay start of Pharmacological VTE agent (>24hrs) due to surgical blood loss or risk of bleeding: no

## 2014-02-04 NOTE — Transfer of Care (Signed)
Immediate Anesthesia Transfer of Care Note  Patient: Alyssa Sanders  Procedure(s) Performed: Procedure(s): OPEN REDUCTION INTERNAL FIXATION (ORIF) LEFT BICONDYLAR TIBIAL PLATEAU FRACTURE (Left)  Patient Location: PACU  Anesthesia Type:General  Level of Consciousness: awake, alert  and oriented  Airway & Oxygen Therapy: Patient Spontanous Breathing and Patient connected to nasal cannula oxygen  Post-op Assessment: Report given to PACU RN  Post vital signs: Reviewed and stable  Complications: No apparent anesthesia complications
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
## 2014-02-04 NOTE — Progress Notes (Signed)
PT Cancellation Note  Patient Details Name: Alyssa Sanders MRN: [id number] DOB: 05/19/1965   Cancelled Treatment:    Reason Eval/Treat Not Completed: Patient at procedure or test/unavailable. Pt off unit for LLE ORIF. PT to follow-up after surgery when medically stable per MD orders.   Karolee StampsGray, Yahaira Bruski [facility] 02/04/2014, 2:23 PM

## 2014-02-04 NOTE — Progress Notes (Signed)
Patient ID: Alyssa Sanders, female   DOB: 11/17/1965, 48 y.o.   MRN: [id number] Mrs. Sedonia SmallRoberson understands fully that we are recommending ORIF of her left proximal tibia fracture.  I have explained the risks and benefits of surgery and informed consent is obtained.
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
## 2014-02-04 NOTE — Progress Notes (Signed)
[facility] PROGRESS NOTE  Vassie LollDebbie A Shean ZOX:[id number] DOB: 11/16/1965 DOA: 01/31/2014 PCP: Dortha KernBLISS, [name], MD  Assessment/Plan: L3 fracture and left tibial plateau fracture - Management per primary team - Patient will need rehabilitation -L3 Fracture; CT chest/abdomen/pelvis with evidence of L3 fracture without sig loss of vertebral body height. -Neurosurgery on board- fracture would heal in orthosis, recommends TLSO. Follow up with Dr. Noel Geroldohen who has preformed prior surgeries.    Migraine - Pt is currently on Elavil for prophylaxis - will decrease dose as plan will be to gradually wean this patient off this medication given that this medication increases seizure risk.    Syncope - Etiology uncertain. Neurology on board currently recommending further outpatient workup with the neurologist. The patient has had soft blood pressures will continue to evaluate and make further recommendations after surgical procedure. Given current fractures it will be difficult to obtain orthostatic vital signs. If patient is intravascularly depleted this could have led to orthostatic changes. - No plans initiated anti-epileptic medication.    Code Status: Full Family Communication: Discussed with spouse at bedside Disposition Plan: Pending further recommendations from specialist   Consultants:  Orthopedic surgery  PM & R  Procedures:  To be determined  Antibiotics:  None  HPI/Subjective: Discussed neurology's findings with patient. Recommended patient not drive after discharge. She is to follow-up with neurologist after discharge for further evaluation recommendations  Objective: Filed Vitals:   02/04/14 0500  BP: 105/46  Pulse: 81  Temp: 98.8 F (37.1 C)  Resp: 20    Intake/Output Summary (Last 24 hours) at 02/04/14 1703 Last data filed at 02/04/14 1651  Gross per 24 hour  Intake   1540 ml  Output   1700 ml  Net   -160 ml   Filed Weights   01/31/14 2119  Weight:  85.276 kg (188 lb)    Exam:   General:  Patient in no acute distress, alert and awake  Cardiovascular: No cyanosis or extremity edema  Respiratory: No increased work of breathing, no audible wheezes, chest rise equal  Abdomen: Nondistended, no guarding  Musculoskeletal: No clubbing   Data Reviewed: Basic Metabolic Panel:  Recent Labs Lab 02/01/14 0416  NA 134*  K 5.3  CL 97  CO2 26  GLUCOSE 118*  BUN 12  CREATININE 0.47*  CALCIUM 8.3*   Liver Function Tests:  Recent Labs Lab 02/01/14 0416  AST 30  ALT 29  ALKPHOS 147*  BILITOT 0.5  PROT 6.1  ALBUMIN 3.0*   No results for input(s): LIPASE, AMYLASE in the last 168 hours. No results for input(s): AMMONIA in the last 168 hours. CBC:  Recent Labs Lab 02/01/14 0416 02/02/14 1105  WBC 9.3 12.7*  HGB 8.5* 8.4*  HCT 26.2* 26.1*  MCV 71.0* 71.3*  PLT 196 200   Cardiac Enzymes: No results for input(s): CKTOTAL, CKMB, CKMBINDEX, TROPONINI in the last 168 hours. BNP (last 3 results) No results for input(s): PROBNP in the last 8760 hours. CBG:  Recent Labs Lab 02/04/14 1514  GLUCAP 76    Recent Results (from the past 240 hour(s))  Surgical pcr screen     Status: None   Collection Time: 02/03/14 10:55 PM  Result Value Ref Range Status   MRSA, PCR NEGATIVE NEGATIVE Final   Staphylococcus aureus NEGATIVE NEGATIVE Final    Comment:        The Xpert SA Assay (FDA approved for NASAL specimens in patients over 49 years of age), is one component of a  comprehensive surveillance program.  Test performance has been validated by [facility] for patients greater than or equal to 49 year old. It is not intended to diagnose infection nor to guide or monitor treatment.      Studies: No results found.  Scheduled Meds: . [MAR Hold] amitriptyline  50 mg Oral QHS  . [MAR Hold] buPROPion  150 mg Oral Daily  . [MAR Hold] docusate sodium  100 mg Oral BID  . [MAR Hold] enoxaparin (LOVENOX) injection  40 mg  Subcutaneous Q24H  . [MAR Hold] LORazepam  1 mg Oral BID  . [MAR Hold] pantoprazole  40 mg Oral Daily  . [MAR Hold] polyethylene glycol  17 g Oral Daily  . [MAR Hold] traMADol  100 mg Oral 4 times per day   Continuous Infusions: . lactated ringers 10 mL/hr at 02/04/14 1517    Time spent: > 35 minutes    Penny PiaVEGA, Denina Rieger  [facility] Pager [phone]. If 7PM-7AM, please contact night-coverage at [email or web address], password [email or web address] 02/04/2014, 5:03 PM  LOS: 4 days

## 2014-02-04 NOTE — Anesthesia Procedure Notes (Signed)
Procedure Name: Intubation Date/Time: 02/04/2014 3:45 PM Performed by: Jefm MilesENNIE, Rony Ratz E Pre-anesthesia Checklist: Patient identified, Emergency Drugs available, Suction available, Patient being monitored and Timeout performed Patient Re-evaluated:Patient Re-evaluated prior to inductionOxygen Delivery Method: Circle system utilized Preoxygenation: Pre-oxygenation with 100% oxygen Intubation Type: IV induction Ventilation: Mask ventilation without difficulty Laryngoscope Size: Mac and 3 Grade View: Grade I Tube type: Oral Tube size: 7.0 mm Number of attempts: 1 Airway Equipment and Method: Stylet Placement Confirmation: ETT inserted through vocal cords under direct vision,  positive ETCO2 and breath sounds checked- equal and bilateral Secured at: 20 cm Tube secured with: Tape Dental Injury: Teeth and Oropharynx as per pre-operative assessment

## 2014-02-04 NOTE — Anesthesia Postprocedure Evaluation (Signed)
  Anesthesia Post-op Note  Patient: Alyssa Sanders  Procedure(s) Performed: Procedure(s): OPEN REDUCTION INTERNAL FIXATION (ORIF) LEFT BICONDYLAR TIBIAL PLATEAU FRACTURE (Left)  Patient Location: PACU  Anesthesia Type:General  Level of Consciousness: awake and alert   Airway and Oxygen Therapy: Patient Spontanous Breathing  Post-op Pain: mild  Post-op Assessment: Post-op Vital signs reviewed  Post-op Vital Signs: stable  Last Vitals:  Filed Vitals:   02/04/14 1852  BP: 134/72  Pulse: 82  Temp:   Resp:     Complications: No apparent anesthesia complications

## 2014-02-04 NOTE — Progress Notes (Signed)
Rehab admissions - I met with pt and her family in follow up to rehab MD consult. They are interested in pursuing inpatient rehab and further questions were answered. Informational brochures were given. Pt is to have L tibial ORIF today and was somewhat drowsy during my interaction. Husband was able to share baseline details as well. Two daughters were present.  Pt/husband shared that pt has two insurances: primary insurance is [employer] and secondary insurance is [employer] [place]. I will now open her case with both insurances to seek insurance authorizations.  We will consider possible inpatient rehab admission following her completed surgery and medical clearance, insurance approval and our bed availability.  I will check on pt's status on Monday and keep the pt/family and medical team aware of any updates.  Please call me with any questions. Thanks.  Nanetta Batty, PT Rehabilitation Admissions Coordinator [phone]

## 2014-02-05 MED ORDER — HYDROMORPHONE HCL 1 MG/ML IJ SOLN
1.0000 mg | INTRAMUSCULAR | Status: DC | PRN
  Administered 2014-02-05 – 2014-02-07 (×20): 1 mg via INTRAVENOUS
  Filled 2014-02-05 (×14): qty 1

## 2014-02-05 NOTE — Progress Notes (Signed)
PT Cancellation Note  Patient Details Name: Alyssa Sanders MRN: [id number] DOB: 02/11/1966   Cancelled Treatment:    Reason Eval/Treat Not Completed: Pain limiting ability to participate.  Pt reporting 10/10 pain in left leg.  RN student reports pt has had all of her pain medication she can have right now.  Pt refusing to get up OOB, but did just return to bed [facility].  I highly encouraged her to get moving while she has all her pain medication on board, but she continued to refuse.  PT will check back tomorrow (of which I informed her she will need to get up then).  We will make sure she is pre medicated again tomorrow.   Thanks,    Rollene Rotundaebecca B. Dionne Rossa, PT, DPT [phone]   02/05/2014, 9:07 AM
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
## 2014-02-05 NOTE — Progress Notes (Signed)
Patient ID: Alyssa Sanders, female   DOB: 09/26/1965, 48 y.o.   MRN: [id number] Patient complains of intense pain left knee. The dressing was taken down there is no fracture blisters the incision was well approximated no signs of any increased swelling. Patient will continue with ice and elevation and will work on range of motion of her ankle to help facilitate decrease swelling. Physical therapy touchdown weightbearing on the left.
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
## 2014-02-05 NOTE — Progress Notes (Signed)
Work up negative. I have decreased Elavil dose. Neurology recommendations. Patient will need to follow-up with neurology as outpatient for further evaluation recommendations. Would also continue physical therapy after discharge. We'll sign off should any other medical questions arise please feel free to contact us.  Alyssa Sanders, [employer]O
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
## 2014-02-06 NOTE — Social Work (Signed)
Clinical Social Work Department BRIEF PSYCHOSOCIAL ASSESSMENT 02/06/2014  Patient:  Alyssa Sanders,[name]     Account Number:  [id number]     Admit date:  01/31/2014  Clinical Social Worker:  Eddie CandleLINDSEY,Cathline Dowen, LCSW  Date/Time:  02/06/2014 05:41 PM  Referred by:  Physician  Date Referred:   Referred for  SNF Placement   Other Referral:   Interview type:  Patient Other interview type:   Husband present    PSYCHOSOCIAL DATA Living Status:  FAMILY Admitted from facility:   Level of care:   Primary support name:  Mamie Nickandall Chamblin Primary support relationship to patient:  SPOUSE Degree of support available:   Pateint and husband stated that they live in the home with college and high school children that can help provide support.    CURRENT CONCERNS Current Concerns  Post-Acute Placement   Other Concerns:   Patient would prefer CIR placement    SOCIAL WORK ASSESSMENT / PLAN CIR will assess patient in order to identify possibilty of placement.  Patient is willing to go to a SNF if necessary but would prefer to go home with Home Health if she cannot go to CIR.   Assessment/plan status:  Information/[facility]erral to [facility] Other assessment/ plan:   Information/referral to community resources:   [place]    PATIENT'S/FAMILY'S RESPONSE TO PLAN OF CARE: Patient and husband in agreement.   Beverly Sessionsywan J Shriyans Kuenzi MSW, LCSW [phone]

## 2014-02-06 NOTE — Progress Notes (Signed)
Patient ID: Alyssa Sanders, female   DOB: 04/05/1965, 48 y.o.   MRN: [id number] Patient's pain is much better controlled this morning. She is able to move her foot and ankle without problems. Patient is reluctant to move her knee. Patient is awaiting approval from insurance for either inpatient or outpatient rehabilitation. It is okay for the patient to use her old back brace for her lumbar spine fracture. She does not need to use the new brace.
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
## 2014-02-07 ENCOUNTER — Inpatient Hospital Stay (HOSPITAL_COMMUNITY)
Admission: RE | Admit: 2014-02-07 | Discharge: 2014-02-17 | DRG: 560 | Disposition: A | Discharge status: Still In Hospital | Payer: BC Managed Care – PPO | Source: Intra-hospital | Attending: Physical Medicine & Rehabilitation | Admitting: Physical Medicine & Rehabilitation

## 2014-02-07 DIAGNOSIS — F419 Anxiety disorder, unspecified: Secondary | ICD-10-CM | POA: Diagnosis not present

## 2014-02-07 DIAGNOSIS — S2241XD Multiple fractures of ribs, right side, subsequent encounter for fracture with routine healing: Secondary | ICD-10-CM | POA: Diagnosis not present

## 2014-02-07 DIAGNOSIS — R52 Pain, unspecified: Secondary | ICD-10-CM

## 2014-02-07 DIAGNOSIS — S32039A Unspecified fracture of third lumbar vertebra, initial encounter for closed fracture: Secondary | ICD-10-CM | POA: Diagnosis present

## 2014-02-07 DIAGNOSIS — S32039D Unspecified fracture of third lumbar vertebra, subsequent encounter for fracture with routine healing: Secondary | ICD-10-CM

## 2014-02-07 DIAGNOSIS — S82142D Displaced bicondylar fracture of left tibia, subsequent encounter for closed fracture with routine healing: Secondary | ICD-10-CM | POA: Diagnosis present

## 2014-02-07 DIAGNOSIS — D62 Acute posthemorrhagic anemia: Secondary | ICD-10-CM | POA: Diagnosis present

## 2014-02-07 DIAGNOSIS — M549 Dorsalgia, unspecified: Secondary | ICD-10-CM

## 2014-02-07 DIAGNOSIS — Z9884 Bariatric surgery status: Secondary | ICD-10-CM

## 2014-02-07 DIAGNOSIS — Z79899 Other long term (current) drug therapy: Secondary | ICD-10-CM

## 2014-02-07 DIAGNOSIS — R413 Other amnesia: Secondary | ICD-10-CM | POA: Diagnosis not present

## 2014-02-07 DIAGNOSIS — S82142S Displaced bicondylar fracture of left tibia, sequela: Secondary | ICD-10-CM | POA: Diagnosis not present

## 2014-02-07 DIAGNOSIS — F418 Other specified anxiety disorders: Secondary | ICD-10-CM | POA: Diagnosis present

## 2014-02-07 DIAGNOSIS — S32039S Unspecified fracture of third lumbar vertebra, sequela: Secondary | ICD-10-CM | POA: Diagnosis not present

## 2014-02-07 DIAGNOSIS — F329 Major depressive disorder, single episode, unspecified: Secondary | ICD-10-CM | POA: Diagnosis present

## 2014-02-07 DIAGNOSIS — S82142A Displaced bicondylar fracture of left tibia, initial encounter for closed fracture: Secondary | ICD-10-CM | POA: Diagnosis present

## 2014-02-07 LAB — CBC
HCT: 21.1 % — ABNORMAL LOW (ref 36.0–46.0)
HEMOGLOBIN: 7 g/dL — AB (ref 12.0–15.0)
MCH: 23.6 pg — ABNORMAL LOW (ref 26.0–34.0)
MCHC: 33.2 g/dL (ref 30.0–36.0)
MCV: 71.3 fL — ABNORMAL LOW (ref 78.0–100.0)
PLATELETS: 284 10*3/uL (ref 150–400)
RBC: 2.96 MIL/uL — ABNORMAL LOW (ref 3.87–5.11)
RDW: 18.7 % — ABNORMAL HIGH (ref 11.5–15.5)
WBC: 10.2 10*3/uL (ref 4.0–10.5)

## 2014-02-07 LAB — CREATININE, SERUM
CREATININE: 0.45 mg/dL — AB (ref 0.50–1.10)
GFR calc Af Amer: >90 mL/min (ref >90)

## 2014-02-07 MED ORDER — OXYCODONE HCL 5 MG PO TABS
10.0000 mg | ORAL_TABLET | ORAL | Status: DC | PRN
  Filled 2014-02-07: qty 4

## 2014-02-07 MED ORDER — TRAMADOL HCL 50 MG PO TABS
100.0000 mg | ORAL_TABLET | Freq: Four times a day (QID) | ORAL | Status: DC | PRN
  Administered 2014-02-07 – 2014-02-16 (×23): 100 mg via ORAL
  Filled 2014-02-07 (×27): qty 2

## 2014-02-07 MED ORDER — BISACODYL 10 MG RE SUPP: 10.0000 mg | Freq: Every day | RECTAL | Status: DC | PRN

## 2014-02-07 MED ORDER — BUPROPION HCL ER (XL) 150 MG PO TB24
150.0000 mg | ORAL_TABLET | Freq: Every day | ORAL | Status: DC
  Administered 2014-02-08 – 2014-02-17 (×10): 150 mg via ORAL
  Filled 2014-02-07 (×12): qty 1

## 2014-02-07 MED ORDER — POLYETHYLENE GLYCOL 3350 17 G PO PACK
17.0000 g | PACK | Freq: Every day | ORAL | Status: DC
  Administered 2014-02-08 – 2014-02-10 (×3): 17 g via ORAL
  Filled 2014-02-07 (×5): qty 1

## 2014-02-07 MED ORDER — METHOCARBAMOL 500 MG PO TABS: 500.0000 mg | ORAL_TABLET | Freq: Four times a day (QID) | ORAL | Status: DC | PRN

## 2014-02-07 MED ORDER — LORAZEPAM 1 MG PO TABS
1.0000 mg | ORAL_TABLET | Freq: Two times a day (BID) | ORAL | Status: DC
  Administered 2014-02-07 – 2014-02-17 (×20): 1 mg via ORAL
  Filled 2014-02-07 (×21): qty 1

## 2014-02-07 MED ORDER — ONDANSETRON HCL 4 MG PO TABS: 4.0000 mg | ORAL_TABLET | Freq: Four times a day (QID) | ORAL | Status: DC | PRN

## 2014-02-07 MED ORDER — HYDROMORPHONE HCL 1 MG/ML IJ SOLN
1.0000 mg | INTRAMUSCULAR | Status: DC | PRN
  Administered 2014-02-07: 1 mg via INTRAVENOUS
  Filled 2014-02-07 (×2): qty 1

## 2014-02-07 MED ORDER — ENOXAPARIN SODIUM 40 MG/0.4ML ~~LOC~~ SOLN: 40.0000 mg | SUBCUTANEOUS | Status: DC

## 2014-02-07 MED ORDER — ACETAMINOPHEN 325 MG PO TABS
325.0000 mg | ORAL_TABLET | ORAL | Status: DC | PRN
  Administered 2014-02-09 – 2014-02-13 (×5): 650 mg via ORAL
  Filled 2014-02-07 (×5): qty 2

## 2014-02-07 MED ORDER — PANTOPRAZOLE SODIUM 40 MG PO TBEC
40.0000 mg | DELAYED_RELEASE_TABLET | Freq: Every day | ORAL | Status: DC
  Administered 2014-02-08 – 2014-02-17 (×10): 40 mg via ORAL
  Filled 2014-02-07 (×10): qty 1

## 2014-02-07 MED ORDER — METHOCARBAMOL 500 MG PO TABS
1000.0000 mg | ORAL_TABLET | Freq: Three times a day (TID) | ORAL | Status: DC
  Administered 2014-02-07 – 2014-02-08 (×4): 1000 mg via ORAL
  Filled 2014-02-07 (×9): qty 2

## 2014-02-07 MED ORDER — OXYCODONE HCL 5 MG PO TABS
20.0000 mg | ORAL_TABLET | ORAL | Status: DC | PRN
  Administered 2014-02-07 – 2014-02-08 (×6): 20 mg via ORAL
  Administered 2014-02-08: 15 mg via ORAL
  Administered 2014-02-08 – 2014-02-09 (×4): 20 mg via ORAL
  Filled 2014-02-07 (×11): qty 4

## 2014-02-07 MED ORDER — AMITRIPTYLINE HCL 25 MG PO TABS
25.0000 mg | ORAL_TABLET | Freq: Every day | ORAL | Status: DC
  Administered 2014-02-07 – 2014-02-16 (×10): 25 mg via ORAL
  Filled 2014-02-07 (×11): qty 1

## 2014-02-07 MED ORDER — METHOCARBAMOL 500 MG PO TABS
1000.0000 mg | ORAL_TABLET | Freq: Three times a day (TID) | ORAL | Status: DC
  Administered 2014-02-07 (×2): 1000 mg via ORAL
  Filled 2014-02-07 (×2): qty 2

## 2014-02-07 MED ORDER — DOCUSATE SODIUM 100 MG PO CAPS
100.0000 mg | ORAL_CAPSULE | Freq: Two times a day (BID) | ORAL | Status: DC
  Administered 2014-02-07 – 2014-02-15 (×16): 100 mg via ORAL
  Filled 2014-02-07 (×20): qty 1

## 2014-02-07 MED ORDER — SORBITOL 70 % SOLN: 30.0000 mL | Freq: Every day | Status: DC | PRN

## 2014-02-07 MED ORDER — ZOLPIDEM TARTRATE 5 MG PO TABS
5.0000 mg | ORAL_TABLET | Freq: Every day | ORAL | Status: DC
  Administered 2014-02-07 – 2014-02-16 (×10): 5 mg via ORAL
  Filled 2014-02-07 (×10): qty 1

## 2014-02-07 MED ORDER — ZOLMITRIPTAN 2.5 MG PO TABS
5.0000 mg | ORAL_TABLET | ORAL | Status: DC | PRN
Start: 2014-02-07 — End: 2014-02-17

## 2014-02-07 MED ORDER — TRAZODONE HCL 50 MG PO TABS: 50.0000 mg | ORAL_TABLET | Freq: Every evening | ORAL | Status: DC | PRN

## 2014-02-07 MED ORDER — ONDANSETRON HCL 4 MG/2ML IJ SOLN: 4.0000 mg | Freq: Four times a day (QID) | INTRAMUSCULAR | Status: DC | PRN

## 2014-02-07 MED ORDER — ENOXAPARIN SODIUM 40 MG/0.4ML ~~LOC~~ SOLN
40.0000 mg | SUBCUTANEOUS | Status: DC
  Filled 2014-02-07 (×2): qty 0.4
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
## 2014-02-07 NOTE — Clinical Social Work Note (Signed)
Per chart review, pt [facility] been [facility] at time of discharge. [facility]. CSW signing off. Thank you.  Marcelline Deistmily Dove [place]am, LCSWA [phone]) Licensed Clinical Social Worker Orthopedics [phone]([id number]) and Surgical [phone]([id number])
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
## 2014-02-07 NOTE — Progress Notes (Signed)
Rehab admissions - I did [place] [place]. I spoke with trauma PA and pt is medically cleared for inpatient rehab. Bed is available and will admit today.  I spoke with pt and her dtr who were both pleased with the plan. Pt's husband updated of the plan by phone through pt's dtr. I will complete admission paperwork with pt shortly.  Marcelino DusterMichelle, trauma case manager is aware of the plan. Please call me with any questions. Thanks.  Juliann MuleJanine Daemyn Gariepy, PT Rehabilitation Admissions Coordinator [phone]
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
## 2014-02-07 NOTE — Progress Notes (Signed)
Patient transferring to inpatient rehab 4W. Report called and given to Angle.
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
## 2014-02-07 NOTE — Progress Notes (Signed)
Rehab admissions - I am following pt's case and noted that pt has had ORIF surgery completed on Friday. I have submitted updated clinical information from the weekend to [facility] [place]. I will also need to submit completed OT evaluation to insurance and have paged OT to communicate this need. I will keep the pt/family and medical team aware of any updates as I wait to hear from insurance.  Please call me with any questions. Thanks.  Juliann MuleJanine Yoshi Mancillas, PT Rehabilitation Admissions Coordinator [phone]
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
## 2014-02-07 NOTE — Discharge Instructions (Signed)
No weight bearing on left leg at all. Only need to wear the knee brace when up with therapy. Can slightly bend your knee. You can get your incisions wet in the shower daily; then new dry dressing

## 2014-02-08 ENCOUNTER — Inpatient Hospital Stay (HOSPITAL_COMMUNITY): Payer: BC Managed Care – PPO

## 2014-02-08 DIAGNOSIS — R609 Edema, unspecified: Secondary | ICD-10-CM

## 2014-02-08 LAB — COMPREHENSIVE METABOLIC PANEL
ALT: 11 U/L (ref 0–35)
ANION GAP: 11 (ref 5–15)
AST: 12 U/L (ref 0–37)
Albumin: 2.4 g/dL — ABNORMAL LOW (ref 3.5–5.2)
Alkaline Phosphatase: 101 U/L (ref 39–117)
BUN: 10 mg/dL (ref 6–23)
CALCIUM: 8.5 mg/dL (ref 8.4–10.5)
CO2: 27 meq/L (ref 19–32)
CREATININE: 0.46 mg/dL — AB (ref 0.50–1.10)
Chloride: 101 mEq/L (ref 96–112)
GFR calc Af Amer: >90 mL/min (ref >90)
Glucose, Bld: 88 mg/dL (ref 70–99)
Potassium: 4.2 mEq/L (ref 3.7–5.3)
Sodium: 139 mEq/L (ref 137–147)
Total Bilirubin: 0.5 mg/dL (ref 0.3–1.2)
Total Protein: 6.1 g/dL (ref 6.0–8.3)

## 2014-02-08 LAB — PROTIME-INR
INR: 1.1 (ref 0.00–1.49)
Prothrombin Time: 14.3 seconds (ref 11.6–15.2)

## 2014-02-08 LAB — CBC WITH DIFFERENTIAL/PLATELET
BASOS PCT: 0 % (ref 0–1)
Basophils Absolute: 0 10*3/uL (ref 0.0–0.1)
EOS PCT: 4 % (ref 0–5)
Eosinophils Absolute: 0.4 10*3/uL (ref 0.0–0.7)
HEMATOCRIT: 21.2 % — AB (ref 36.0–46.0)
HEMOGLOBIN: 7 g/dL — AB (ref 12.0–15.0)
LYMPHS PCT: 22 % (ref 12–46)
Lymphs Abs: 2.3 10*3/uL (ref 0.7–4.0)
MCH: 24.1 pg — ABNORMAL LOW (ref 26.0–34.0)
MCHC: 33 g/dL (ref 30.0–36.0)
MCV: 72.9 fL — AB (ref 78.0–100.0)
MONO ABS: 0.8 10*3/uL (ref 0.1–1.0)
MONOS PCT: 8 % (ref 3–12)
NEUTROS ABS: 7 10*3/uL (ref 1.7–7.7)
Neutrophils Relative %: 66 % (ref 43–77)
Platelets: 290 10*3/uL (ref 150–400)
RBC: 2.91 MIL/uL — ABNORMAL LOW (ref 3.87–5.11)
RDW: 18.9 % — ABNORMAL HIGH (ref 11.5–15.5)
WBC: 10.5 10*3/uL (ref 4.0–10.5)

## 2014-02-08 MED ORDER — OXYCODONE HCL 5 MG PO TABS
5.0000 mg | ORAL_TABLET | Freq: Once | ORAL | Status: AC
  Administered 2014-02-08: 5 mg via ORAL
  Filled 2014-02-08: qty 1

## 2014-02-08 MED ORDER — WARFARIN - PHARMACIST DOSING INPATIENT: Freq: Every day | Status: DC

## 2014-02-08 MED ORDER — WARFARIN SODIUM 5 MG PO TABS
5.0000 mg | ORAL_TABLET | Freq: Once | ORAL | Status: AC
  Administered 2014-02-08: 5 mg via ORAL
  Filled 2014-02-08: qty 1

## 2014-02-08 MED ORDER — WARFARIN VIDEO: Freq: Once | Status: DC

## 2014-02-08 MED ORDER — ENOXAPARIN SODIUM 30 MG/0.3ML ~~LOC~~ SOLN
30.0000 mg | Freq: Two times a day (BID) | SUBCUTANEOUS | Status: DC
  Administered 2014-02-08 – 2014-02-14 (×12): 30 mg via SUBCUTANEOUS
  Filled 2014-02-08 (×15): qty 0.3

## 2014-02-08 MED ORDER — COUMADIN BOOK
Freq: Once | Status: DC
  Filled 2014-02-08: qty 1

## 2014-02-08 NOTE — Plan of Care (Signed)
Problem: RH PAIN MANAGEMENT Goal: RH STG PAIN MANAGED AT OR BELOW PT'S PAIN GOAL 8 or less on scale of 1-10  Outcome: Progressing
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
## 2014-02-08 NOTE — Progress Notes (Signed)
*  PRELIMINARY RESULTS* Vascular Ultrasound Lower extremity venous duplex has been completed.  Preliminary findings: No evidence of DVT bilaterally. There is superficial thrombosis noted in the left GSV at the level of the saphenofemoral junction, but not entering the common femoral vein.  Farrel DemarkJill Eunice, RDMS, RVT  02/08/2014, 3:23 PM
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
## 2014-02-08 NOTE — Plan of Care (Signed)
Problem: SCI BOWEL ELIMINATION Goal: RH STG MANAGE BOWEL WITH ASSISTANCE STG Manage Bowel with Mod I Assistance.  Outcome: Progressing Goal: RH STG SCI MANAGE BOWEL WITH MEDICATION WITH ASSISTANCE STG SCI Manage bowel with medication with Min assistance.  Outcome: Progressing Goal: RH STG SCI MANAGE BOWEL PROGRAM W/ASSIST OR AS APPROPRIATE STG SCI Manage bowel program w/ Min assist or as appropriate.  Outcome: Progressing  Problem: SCI BLADDER ELIMINATION Goal: RH STG MANAGE BLADDER WITH ASSISTANCE STG Manage Bladder With Mod I Assistance  Outcome: Progressing  Problem: RH SKIN INTEGRITY Goal: RH STG SKIN FREE OF INFECTION/BREAKDOWN No new skin breakdown with Min assist of caregiver  Outcome: Progressing Goal: RH STG MAINTAIN SKIN INTEGRITY WITH ASSISTANCE STG Maintain Skin Integrity With Min Assistance. For buttocks and left leg total assist,  Outcome: Progressing Goal: RH STG ABLE TO PERFORM INCISION/WOUND CARE W/ASSISTANCE STG Able To Perform Incision/Wound Care With Total Assistance of caregiver for left leg  Outcome: Progressing  Problem: RH SAFETY Goal: RH STG ADHERE TO SAFETY PRECAUTIONS W/ASSISTANCE/DEVICE STG Adhere to Safety Precautions With Supervision Assistance/Device.  Outcome: Progressing Goal: RH STG DECREASED RISK OF FALL WITH ASSISTANCE STG Decreased Risk of Fall With Supervision Assistance.  Outcome: Progressing  Problem: RH PAIN MANAGEMENT Goal: RH STG PAIN MANAGED AT OR BELOW PT'S PAIN GOAL 8 or less on scale of 1-10  Outcome: Progressing
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
## 2014-02-08 NOTE — Plan of Care (Signed)
Problem: SCI BOWEL ELIMINATION Goal: RH STG MANAGE BOWEL WITH ASSISTANCE STG Manage Bowel with Mod I Assistance.  Outcome: Progressing Goal: RH STG SCI MANAGE BOWEL WITH MEDICATION WITH ASSISTANCE STG SCI Manage bowel with medication with Min assistance.  Outcome: Progressing Goal: RH STG SCI MANAGE BOWEL PROGRAM W/ASSIST OR AS APPROPRIATE STG SCI Manage bowel program w/ Min assist or as appropriate.  Outcome: Progressing  Problem: SCI BLADDER ELIMINATION Goal: RH STG MANAGE BLADDER WITH ASSISTANCE STG Manage Bladder With Mod I Assistance  Outcome: Progressing  Problem: RH SKIN INTEGRITY Goal: RH STG SKIN FREE OF INFECTION/BREAKDOWN No new skin breakdown with Min assist of caregiver  Outcome: Progressing Goal: RH STG MAINTAIN SKIN INTEGRITY WITH ASSISTANCE STG Maintain Skin Integrity With Min Assistance. For buttocks and left leg total assist,  Outcome: Progressing Goal: RH STG ABLE TO PERFORM INCISION/WOUND CARE W/ASSISTANCE STG Able To Perform Incision/Wound Care With Total Assistance of caregiver for left leg  Outcome: Progressing  Problem: RH SAFETY Goal: RH STG ADHERE TO SAFETY PRECAUTIONS W/ASSISTANCE/DEVICE STG Adhere to Safety Precautions With Supervision Assistance/Device.  Outcome: Progressing Goal: RH STG DECREASED RISK OF FALL WITH ASSISTANCE STG Decreased Risk of Fall With Supervision Assistance.  Outcome: Progressing

## 2014-02-08 NOTE — Progress Notes (Signed)
Patient information reviewed and entered into eRehab system by Raymar Joiner, RN, CRRN, PPS Coordinator.  Information including medical coding and functional independence measure will be reviewed and updated through discharge.    
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
## 2014-02-08 NOTE — Progress Notes (Signed)
CRITICAL VALUE ALERT  Critical value received:  HGB 7.0  Date of notification:  02/07/14  Time of notification:  2157  Critical value read back:Yes.   Nurse who received alert:  Clista BernhardtWaade Rutherford Alarie  MD notified (1st page):  Janith LimaEunice Thomas,Nurse Practitioner  Time of first page:  2139  MD notified (2nd page):  Time of second page:  Responding MD:  Janith LimaEunice Thomas,Nurse Practitioner  Time MD responded:  2157

## 2014-02-08 NOTE — Plan of Care (Signed)
Problem: RH Stairs Goal: LTG Patient will ambulate up and down stairs w/assist (PT) LTG: Patient will ambulate up and down # of stairs with assistance (PT) With one rail
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
## 2014-02-08 NOTE — Care Management Note (Signed)
[facility] Individual Statement of Services  Patient Name:  Alyssa Sanders  Date:  02/08/2014  Welcome to the [facility].  Our goal is to provide you with an individualized program based on your diagnosis and situation, designed to meet your specific needs.  With this comprehensive rehabilitation program, you will be expected to participate in at least 3 hours of rehabilitation therapies Monday-Friday, with modified therapy programming on the weekends.  Your rehabilitation program will include the following services:  Physical Therapy (PT), Occupational Therapy (OT), 24 hour per day rehabilitation nursing, Therapeutic Recreaction (TR), Neuropsychology, Case Management (Social Worker), Rehabilitation Medicine, Nutrition Services and Pharmacy Services  Weekly team conferences will be held on Tuesdays to discuss your progress.  Your Social Worker will talk with you frequently to get your input and to update you on team discussions.  Team conferences with you and your family in attendance may also be held.  Expected length of stay: 10-14 days  Overall anticipated outcome:  Minimal assistanc  Depending on your progress and recovery, your program may change. Your Social Worker will coordinate services and will keep you informed of any changes. Your Social Worker's name and contact numbers are listed  below.  The following services may also be recommended but are not provided by the Inpatient Rehabilitation [facility]:   Driving Evaluations  Home Health Rehabiltiation Services  Outpatient Rehabilitation Services    Arrangements will be made to provide these services after discharge if needed.  Arrangements include referral to agencies that provide these services.  Your insurance has been verified to be:  [employer] [place] Your primary doctor is:  Joen LauraLaura [name]  Pertinent information will be shared with your doctor and your insurance company.  Social Worker:   De SmetLucy Ralonda Tartt, [place] [phone] or ([phone]) [phone]   Information discussed with and copy given to patient by: Amada JupiterHOYLE, Pharaoh Pio, 02/08/2014, 11:05 AM

## 2014-02-09 ENCOUNTER — Inpatient Hospital Stay (HOSPITAL_COMMUNITY): Payer: Self-pay

## 2014-02-09 ENCOUNTER — Inpatient Hospital Stay (HOSPITAL_COMMUNITY): Payer: BC Managed Care – PPO

## 2014-02-09 LAB — CBC
HEMATOCRIT: 22.2 % — AB (ref 36.0–46.0)
Hemoglobin: 7.1 g/dL — ABNORMAL LOW (ref 12.0–15.0)
MCH: 23 pg — AB (ref 26.0–34.0)
MCHC: 32 g/dL (ref 30.0–36.0)
MCV: 71.8 fL — AB (ref 78.0–100.0)
PLATELETS: 341 10*3/uL (ref 150–400)
RBC: 3.09 MIL/uL — AB (ref 3.87–5.11)
RDW: 18.9 % — ABNORMAL HIGH (ref 11.5–15.5)
WBC: 10.7 10*3/uL — ABNORMAL HIGH (ref 4.0–10.5)

## 2014-02-09 LAB — PROTIME-INR
INR: 1.14 (ref 0.00–1.49)
Prothrombin Time: 14.7 seconds (ref 11.6–15.2)

## 2014-02-09 MED ORDER — HYDROMORPHONE HCL 2 MG PO TABS
4.0000 mg | ORAL_TABLET | Freq: Four times a day (QID) | ORAL | Status: DC | PRN
  Administered 2014-02-09 – 2014-02-13 (×16): 4 mg via ORAL
  Filled 2014-02-09 (×16): qty 2

## 2014-02-09 MED ORDER — METHOCARBAMOL 500 MG PO TABS
1000.0000 mg | ORAL_TABLET | Freq: Four times a day (QID) | ORAL | Status: DC
  Administered 2014-02-09 – 2014-02-17 (×34): 1000 mg via ORAL
  Filled 2014-02-09 (×41): qty 2

## 2014-02-09 MED ORDER — WARFARIN SODIUM 5 MG PO TABS
5.0000 mg | ORAL_TABLET | Freq: Once | ORAL | Status: AC
  Administered 2014-02-09: 5 mg via ORAL
  Filled 2014-02-09: qty 1
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
## 2014-02-09 NOTE — Progress Notes (Signed)
ANTICOAGULATION CONSULT NOTE - Follow Up Consult  Pharmacy Consult for coumadin Indication: superficial thrombosis of saphenofemoral junction  No Active Allergies  Patient Measurements:   Heparin Dosing Weight:   Vital Signs: Temp: 98.2 F (36.8 C) (12/09 0620) Temp Source: Oral (12/09 0620) BP: 105/59 mmHg (12/09 0620) Pulse Rate: 76 (12/09 0620)  Labs:  Recent Labs  02/07/14 1925 02/08/14 0344 02/08/14 1700 02/09/14 0500  HGB 7.0* 7.0*  --  7.1*  HCT 21.1* 21.2*  --  22.2*  PLT 284 290  --  341  LABPROT  --   --  14.3 14.7  INR  --   --  1.10 1.14  CREATININE 0.45* 0.46*  --   --     Estimated Creatinine Clearance: 84.3 mL/min (by C-G formula based on Cr of 0.46).   Medications:  Scheduled:  . amitriptyline  25 mg Oral QHS  . buPROPion  150 mg Oral Daily  . coumadin book   Does not apply Once  . docusate sodium  100 mg Oral BID  . enoxaparin (LOVENOX) injection  30 mg Subcutaneous Q12H  . LORazepam  1 mg Oral BID  . methocarbamol  1,000 mg Oral QID  . pantoprazole  40 mg Oral Daily  . polyethylene glycol  17 g Oral Daily  . warfarin   Does not apply Once  . Warfarin - Pharmacist Dosing Inpatient   Does not apply q1800  . zolpidem  5 mg Oral QHS   Infusions:    Assessment: 48 yo female with superficial thrombosis of saphenofemoral junction is currently on subtherapeutic coumadin.  INR today is 1.14.  Patient is also on lovenox 30mg  sq q12h.  Goal of Therapy:  INR 2-3 Monitor platelets by anticoagulation protocol: Yes   Plan:  Warfarin 5mg  PO tonight x1. D/c lovenox when INR >/= 2 Daily INR Continue to monitor H&H and platelets  Monitor s/sx of bleeding F/u warfarin education Carlisa Eble, Tsz-Yin 02/09/2014,8:26 AM

## 2014-02-09 NOTE — Progress Notes (Signed)
Physical Therapy Session Note  Patient Details  Name: Alyssa LollDebbie A Siebels MRN: [id number] Date of Birth: 10/20/1965  Today's Date: 02/09/2014 PT Individual Time: 0930-1030 PT Individual Time Calculation (min): 60 min   Short Term Goals: Week 1:  PT Short Term Goal 1 (Week 1): Pt will tolerate upright standing for 1 minute PT Short Term Goal 2 (Week 1): Pt will ambulate 20' w/ RW and ModA PT Short Term Goal 3 (Week 1): Pt will perform sit<>stand w/ CGA PT Short Term Goal 4 (Week 1): Pt will negotiate up/down 2 stairs with ModA and B rails  Skilled Therapeutic Interventions/Progress Updates:    Pt received supine in bed, agreeable to participate in therapy. Session focused on upright tolerance, pre-gait activities, w/c propulsion. Moved supine>sit w/ MinA to manage LLE out of bed, then MinA for stand pivot transfer w/ RW bed>w/c. Pt propelled /wc 150' to rehab gym w/ supervision.  In parallel bars worked on upright tolerance, sit<>stands, pre-gait activities. Pt able to move sit<>stand w/ MinA and remain standing w/ CGA. However, pt unable to unweight R foot enough to propel foot forward, instead slides forward without picking up foot from ground. With every step pt complains of increased pain in LLE and back. Pt extremely emotionally labile throughout session due to pain in L knee. Pt able to bring R foot up onto 4 inch step in parallel bars w/ pillow under L foot to prevent WBing. Pt unable to step back down, required w/c to come up behind her and sit down from step.   Discussed with pt and husband alternate means of entering home if pt is unable to negotiate stairs, including use of ramp or having two family members bump pt up/down stairs in w/c. Pt is resistant to idea of discharging home at w/c level, but understands that it may be the safest option.   Session ended in pt's room, left seated in w/c w/ husband present and all needs within reach.  Therapy Documentation Precautions:   Precautions Precautions: Back, Fall Precaution Comments: lumbar fx (TLSO), multiple rib fxs, L tib plateau fx (ORIF for 12/4), now in L bledsoe brace locked in ext Required Braces or Orthoses: Spinal Brace, Other Brace/Splint Spinal Brace: Thoracolumbosacral orthotic, Applied in supine position Other Brace/Splint: Bledsoe brace locked into extension Restrictions Weight Bearing Restrictions: Yes LLE Weight Bearing: Non weight bearing Pain: Pain Assessment Pain Assessment: 0-10 Pain Score: 9  Pain Type: Surgical pain Pain Location: Leg Pain Orientation: Left Pain Descriptors / Indicators: Throbbing;Constant;Burning Pain Frequency: Constant Pain Onset: On-going Patients Stated Pain Goal: 7 Pain Intervention(s): Medication (See eMAR);Repositioned  See FIM for current functional status  Therapy/Group: Individual Therapy  Hosie SpangleGodfrey, Linzee Depaul  Hosie SpangleJess Allyce Bochicchio, PT, DPT 02/09/2014, 7:39 AM

## 2014-02-09 NOTE — Progress Notes (Signed)
Occupational Therapy Session Note  Patient Details  Name: Alyssa Sanders MRN: [id number] Date of Birth: 06/30/1965  Today's Date: 02/09/2014 OT Individual Time: 1100-1200 and 1305-1405 OT Individual Time Calculation (min): 60 min and 60 min    Short Term Goals: Week 1:  OT Short Term Goal 1 (Week 1): STG=LTG due to anticiapted short LOS  Skilled Therapeutic Interventions/Progress Updates:    Session 1: Pt seen for 1:1 OT session with focus on BUE strength, functional transfers, and activity tolerance. Pt received supine in bed requesting to bathe during PM session. Completed supine>sit with mod assist and stand pivot transfer using RW bed>w/c with min assist. Pt with high anxiety during functional transfers, requiring cues of encouragement. Pt propelled self in w/c to therapy gym with 2 rest breaks d/t fatigue. Completed BUE strengthening exercises using 2# dowel bar for first 3 sets and 3# bar for last set of exercises. Pt completed 10 reps of shoulder flexion, external rotation, abduction, biceps, and triceps exercises with rest breaks after every 2-3 exercises. Pt propelled self back to room and transferred to bed at min assist. Pt left supine in bed with all needs in reach.   Session 2: Pt seen for ADL retraining with focus on bed mobility, activity tolerance, use of AE, and pain management. Pt received finishing up toilet task with RN. Pt assisted back to bed to complete bathing and dressing at bed level. Pt required min assist for rolling L<>R with pillow placed between BLEs for comfort. Practiced with use of reacher and sock-aid with good carryover. Pt required total assist for donning underwear over bledsoe brace on LLE. Pt completed grooming task of brushing hair with HOB elevated and frequent rest breaks d/t BUE fatigue. At end of session pt left supine in bed with all needs in reach and ice pack applied to LLE.  Therapy Documentation Precautions:  Precautions Precautions: Back,  Fall Precaution Comments: lumbar fx (TLSO), multiple rib fxs, L tib plateau fx (ORIF for 12/4), now in L bledsoe brace locked in ext Required Braces or Orthoses: Spinal Brace, Other Brace/Splint Spinal Brace: Thoracolumbosacral orthotic, Applied in supine position Other Brace/Splint: Bledsoe brace locked into extension Restrictions Weight Bearing Restrictions: Yes LLE Weight Bearing: Non weight bearing General:   Vital Signs:  Pain: Pain Assessment Pain Assessment: 0-10 Pain Score: 10-Worst pain ever Pain Type: Surgical pain Pain Location: Leg Pain Orientation: Left Pain Intervention(s): Repositioned;Cold applied  See FIM for current functional status  Therapy/Group: Individual Therapy  Alyssa Sanders, Alyssa Sanders 02/09/2014, 12:03 PM

## 2014-02-09 NOTE — Discharge Instructions (Addendum)
Inpatient Rehab Discharge Instructions  Alyssa Sanders Discharge date and time: 02/17/14   Activities/Precautions/ Functional Status: Activity: Nonweightbearing left lower extremity with Bledsoe brace locked in extension. TLSO brace when out of bed Diet: regular diet Wound Care: keep wound clean and dry   Functional status:  ___ No restrictions     ___ Walk up steps independently _X__ 24/7 supervision/assistance   ___ Walk up steps with assistance ___ Intermittent supervision/assistance  ___ Bathe/dress independently ___ Walk with walker     ___ Bathe/dress with assistance ___ Walk Independently    ___ Shower independently _X__ Walk with assistance    _X__ Shower with assistance _X__ No alcohol     ___ Return to work/school ________  Special Instructions: 1. Follow-up Dr. Magnus IvanBlackman for superficial thrombus left saphenofemoral junction/ duration of Coumadin 2. You have one month supply of pain medications. Refills on medications per Dr. Magnus IvanBlackman.  My questions have been answered and I understand these instructions. I will adhere to these goals and the provided educational materials after my discharge from the hospital.    Home healthRN check INR 02/22/2015 results to Dr Joen LauraLaura [name] [phone] fax [phone]   COMMUNITY REFERRALS UPON DISCHARGE:    Home Health:   PT, OT, RN     Agency:[facility] Phone:[phone] Date of last service:02/17/2014  Medical Equipment/Items Ordered:WHEELCHAIR, YOUTH Levan HurstOLLING WALKER, DROP-ARM BEDSIDE COMMODE & TUB BENCH  Agency/Supplier:[facility]    [phone]  OTHER: [facility]-[phone] DR Leavy CellaKAREN POLLARD    Patient/Caregiver Signature _______________________________ Date __________  Clinician Signature _______________________________________ Date __________  Please bring this form and your medication list with you to all your follow-up doctor's appointments.   Information on my medicine - Coumadin   (Warfarin)  This  medication education was reviewed with me or my healthcare representative as part of my discharge preparation.  The pharmacist that spoke with me during my hospital stay was:  Clyda HurdleJeremy So, PharmD  Why was Coumadin prescribed for you? Coumadin was prescribed for you because you have a blood clot or a medical condition that can cause an increased risk of forming blood clots. Blood clots can cause serious health problems by blocking the flow of blood to the heart, lung, or brain. Coumadin can prevent harmful blood clots from forming. As a reminder your indication for Coumadin is:   superficial thrombosis of saphenofemoral junction  What test will check on my response to Coumadin? While on Coumadin (warfarin) you will need to have an INR test regularly to ensure that your dose is keeping you in the desired range. The INR (international normalized ratio) number is calculated from the result of the laboratory test called prothrombin time (PT).  If an INR APPOINTMENT HAS NOT ALREADY BEEN MADE FOR YOU please schedule an appointment to have this lab work done by your health care provider within 7 days. Your INR goal is usually a number between:  2 to 3 or your provider may give you a more narrow range like 2-2.5.  Ask your health care provider during an office visit what your goal INR is.  What  do you need to  know  About  COUMADIN? Take Coumadin (warfarin) exactly as prescribed by your healthcare provider about the same time each day.  DO NOT stop taking without talking to the doctor who prescribed the medication.  Stopping without other blood clot prevention medication to take the place of Coumadin may increase your risk of developing a new clot or stroke.  Get  refills before you run out.  What do you do if you miss a dose? If you miss a dose, take it as soon as you remember on the same day then continue your regularly scheduled regimen the next day.  Do not take two doses of Coumadin at the same  time.  Important Safety Information A possible side effect of Coumadin (Warfarin) is an increased risk of bleeding. You should call your healthcare provider right away if you experience any of the following: ? Bleeding from an injury or your nose that does not stop. ? Unusual colored urine (red or dark brown) or unusual colored stools (red or black). ? Unusual bruising for unknown reasons. ? A serious fall or if you hit your head (even if there is no bleeding).  Some foods or medicines interact with Coumadin (warfarin) and might alter your response to warfarin. To help avoid this: ? Eat a balanced diet, maintaining a consistent amount of Vitamin K. ? Notify your provider about major diet changes you plan to make. ? Avoid alcohol or limit your intake to 1 drink for women and 2 drinks for men per day. (1 drink is 5 oz. wine, 12 oz. beer, or 1.5 oz. liquor.)  Make sure that ANY health care provider who prescribes medication for you knows that you are taking Coumadin (warfarin).  Also make sure the healthcare provider who is monitoring your Coumadin knows when you have started a new medication including herbals and non-prescription products.  Coumadin (Warfarin)  Major Drug Interactions  Increased Warfarin Effect Decreased Warfarin Effect  Alcohol (large quantities) Antibiotics (esp. Septra/Bactrim, Flagyl, Cipro) Amiodarone (Cordarone) Aspirin (ASA) Cimetidine (Tagamet) Megestrol (Megace) NSAIDs (ibuprofen, naproxen, etc.) Piroxicam (Feldene) Propafenone (Rythmol SR) Propranolol (Inderal) Isoniazid (INH) Posaconazole (Noxafil) Barbiturates (Phenobarbital) Carbamazepine (Tegretol) Chlordiazepoxide (Librium) Cholestyramine (Questran) Griseofulvin Oral Contraceptives Rifampin Sucralfate (Carafate) Vitamin K   Coumadin (Warfarin) Major Herbal Interactions  Increased Warfarin Effect Decreased Warfarin Effect  Garlic Ginseng Ginkgo biloba Coenzyme Q10 Green tea St. Johns wort     Coumadin (Warfarin) FOOD Interactions  Eat a consistent number of servings per week of foods HIGH in Vitamin K (1 serving =  cup)  Collards (cooked, or boiled & drained) Kale (cooked, or boiled & drained) Mustard greens (cooked, or boiled & drained) Parsley *serving size only =  cup Spinach (cooked, or boiled & drained) Swiss chard (cooked, or boiled & drained) Turnip greens (cooked, or boiled & drained)  Eat a consistent number of servings per week of foods MEDIUM-HIGH in Vitamin K (1 serving = 1 cup)  Asparagus (cooked, or boiled & drained) Broccoli (cooked, boiled & drained, or raw & chopped) Brussel sprouts (cooked, or boiled & drained) *serving size only =  cup Lettuce, raw (green leaf, endive, romaine) Spinach, raw Turnip greens, raw & chopped   These websites have more information on Coumadin (warfarin):  http://www.king-russell.com/www.coumadin.com; https://www.hines.net/www.ahrq.gov/consumer/coumadin.htm;

## 2014-02-10 ENCOUNTER — Inpatient Hospital Stay (HOSPITAL_COMMUNITY): Payer: BC Managed Care – PPO | Admitting: Physical Therapy

## 2014-02-10 ENCOUNTER — Encounter (HOSPITAL_COMMUNITY): Payer: Self-pay

## 2014-02-10 ENCOUNTER — Inpatient Hospital Stay (HOSPITAL_COMMUNITY): Payer: BC Managed Care – PPO | Admitting: *Deleted

## 2014-02-10 ENCOUNTER — Inpatient Hospital Stay (HOSPITAL_COMMUNITY): Payer: BC Managed Care – PPO

## 2014-02-10 DIAGNOSIS — R413 Other amnesia: Secondary | ICD-10-CM

## 2014-02-10 DIAGNOSIS — F329 Major depressive disorder, single episode, unspecified: Secondary | ICD-10-CM

## 2014-02-10 LAB — CBC
HEMATOCRIT: 23 % — AB (ref 36.0–46.0)
Hemoglobin: 7.3 g/dL — ABNORMAL LOW (ref 12.0–15.0)
MCH: 23 pg — AB (ref 26.0–34.0)
MCHC: 31.7 g/dL (ref 30.0–36.0)
MCV: 72.3 fL — AB (ref 78.0–100.0)
Platelets: 392 10*3/uL (ref 150–400)
RBC: 3.18 MIL/uL — AB (ref 3.87–5.11)
RDW: 19 % — ABNORMAL HIGH (ref 11.5–15.5)
WBC: 9.7 10*3/uL (ref 4.0–10.5)

## 2014-02-10 LAB — PROTIME-INR
INR: 1.31 (ref 0.00–1.49)
Prothrombin Time: 16.4 seconds — ABNORMAL HIGH (ref 11.6–15.2)

## 2014-02-10 MED ORDER — WARFARIN SODIUM 5 MG PO TABS
5.0000 mg | ORAL_TABLET | Freq: Once | ORAL | Status: AC
  Administered 2014-02-10: 5 mg via ORAL
  Filled 2014-02-10: qty 1

## 2014-02-10 MED ORDER — FENTANYL 25 MCG/HR TD PT72
25.0000 ug | MEDICATED_PATCH | TRANSDERMAL | Status: DC
  Administered 2014-02-10: 25 ug via TRANSDERMAL
  Filled 2014-02-10: qty 1

## 2014-02-10 NOTE — Progress Notes (Signed)
Patient pain score 9/10 after PRN dilaudid, patient moaning in discomfort, unable to provide ultram at this time not due until 0000, provided 650mg  of tylenol and ice packs. Will continue to monitor.  Diamantina MonksARPENTER,Haeli Gerlich, RN
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
## 2014-02-10 NOTE — Progress Notes (Signed)
Physical Therapy Session Note  Patient Details  Name: Alyssa Sanders MRN: [id number] Date of Birth: 05/11/1965  Today's Date: 02/10/2014 PT Individual Time: 1115-1207 and 1400-1500 PT Individual Time Calculation (min): 52 min and 60 min Short Term Goals: Week 1:  PT Short Term Goal 1 (Week 1): Pt will tolerate upright standing for 1 minute PT Short Term Goal 2 (Week 1): Pt will ambulate 20' w/ RW and ModA PT Short Term Goal 3 (Week 1): Pt will perform sit<>stand w/ CGA PT Short Term Goal 4 (Week 1): Pt will negotiate up/down 2 stairs with ModA and B rails  Skilled Therapeutic Interventions/Progress Updates:   Session 1: Pt received sitting upright in bed with L knee elevated on pillow and knees bent although bledsoe brace locked at 0 deg. Patient and husband educated on maintaining L knee in full extension with brace locked, how to lower knees into flat position using bed functions after raising HOB, and positioning with pillow so entire LLE is supported on pillow with heel floating. Both verbalized understanding. With HOB flat, patient rolled to L and R with husband supporting LLE in order for TLSO to be donned in supine. Pt transferred supine > sit with husband providing min Sanders for LLE and use of bed rails. Pt performed stand pivot transfers bed > w/c and w/c <> mat table with min Sanders. Patient unable to attempt hopping with RLE and instead pivots foot back and forth on floor for transfers. Pt propelled w/c using BUE x 150 ft with supervision and able to manage R leg rest with supervision, requires assist for L leg rest. Patient transferred sit <> supine on mat with min Sanders for LLE and verbal cues for sequencing. With wedge positioned behind patient, performed LE therex x 20 each exercise: B quad sets with 5 sec hold, L ankle pumps, R hip abduction, attempted assisted LLE SLR and glute sets but patient with increased back pain and unable to perform exercises. After returning to w/c, pt crying due to  back pain and reports being unable to propel w/c back to room. Pt assisted back to room and left sitting in w/c with all needs within reach.   Session 2: Pt received sitting in w/c, ready for therapy. Pt propelled w/c using BUE 2 x 150 ft with supervision. To work on standing tolerance and balance, pt stood at high table using RW for UE support to play game of checkers for 5-10 min at Sanders time with pillow under L foot to maintain NWB status at supervision level. Pt requires 3 seated rest breaks between standing trials due to increased pain. Pt able to hop 3-4 steps and 5-6 steps forward/backward using RW with min Sanders to stand in front of table. Gait training using RW with R shoe donned x 17 ft with overall min guard. Pt very pleased with progress and ability to maintain NWB status. Pt returned to room and left sitting in w/c with husband present. Discussed with OT and patient's husband cleared to assist with stand pivot transfers using RW in room, safety plan updated.   Therapy Documentation Precautions:  Precautions Precautions: Back, Fall Precaution Comments: lumbar fx (TLSO), multiple rib fxs, L tib plateau fx (ORIF for 12/4), now in L bledsoe brace locked in ext Required Braces or Orthoses: Spinal Brace, Other Brace/Splint Spinal Brace: Thoracolumbosacral orthotic, Applied in supine position Other Brace/Splint: Bledsoe brace locked into extension Restrictions Weight Bearing Restrictions: Yes LLE Weight Bearing: Non weight bearing General: PT Amount of  Missed Time (min): 8 Minutes PT Missed Treatment Reason: Other (Comment) (pt finishing getting ready after OT) Pain: Pain Assessment Pain Assessment: 0-10 Pain Score: 8  Pain Intervention(s): Medication (See eMAR) 2nd Pain Site Wong-Baker Pain Rating: Hurts whole lot Pain Type: Acute pain Pain Location: Back Pain Orientation: Mid Pain Descriptors / Indicators: Crying Pain Intervention(s): Emotional support;Rest;Repositioned  See FIM for  current functional status  Therapy/Group: Individual Therapy  Kerney ElbeVarner, Alyssa Sanders 02/10/2014, 12:19 PM

## 2014-02-10 NOTE — Progress Notes (Signed)
Social Work Patient ID: Alyssa Sanders, female   DOB: May 01, 1965, 48 y.o.   MRN: [id number]   Met with pt and husband following team conference on Tuesday.  Both aware and agreeable to targeted d/c date of 12/18 with supervision to minimal assist goals.  Report that their daughter will be on holiday break from school at that time and able to assist when husband at work. Pain management continues to be a struggle for pt and an ongoing discussion with MD/PA.  Did observe in therapy this afternoon and pt much brighter affect and actually taking several "hops" with PT.  Very pleased with herself and her gains today.  Will continue to monitor for support needs and d/c planning needs.  Alyssa Hoffert, LCSW

## 2014-02-10 NOTE — Progress Notes (Signed)
Patient rates pain 10/10, pain score has not dropped below 9/10. Patient stated that in the past Norco has provided her with pain relief, patient states that Dilaudid is not decreasing her pain. Provided patient with PRN Dilaudid and repositioned. Will continue to monitor.  Diamantina MonksARPENTER,Travor Royce, RN
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
## 2014-02-10 NOTE — Progress Notes (Signed)
Occupational Therapy Session Note  Patient Details  Name: Alyssa Sanders MRN: [id number] Date of Birth: 05/26/1965  Today's Date: 02/10/2014 OT Individual Time: [id number] OT Individual Time Calculation (min): 60 min    Short Term Goals: Week 1:  OT Short Term Goal 1 (Week 1): STG=LTG due to anticiapted short LOS  Skilled Therapeutic Interventions/Progress Updates: ADL-retraining with focus on shower level bathing, transfers, and AE use (reacher, LH sponge, leg lifter).    Pt received supine in bed with husband remaining and attentive to all needs.    Pt reports success with toileting hygiene prior to treatment stating she was able to reach w/o use of aid; however pt's husband was alert to possible non-adherence to back precaution component, "no TWISTING" during his witness of her performance.    OT re-educated pt on use of reach extending toilet aids and advised use of an alternate aid: tongs, during this session.   Pt was anxious to bathe at shower and expressed confidence in managing her LLE pain while on tub bench.   Pt performed bed mobility unassisted using bed rail and HOB elevated and transferred to w/c using RW with only steadying assist and min vc for technique.   With TLSO remaining on, completed similar transfer to tub bench and demo'd excellent problem-solving skills to manage LLE, supporting it lengthwise on tub bench to reduce discomfort from dependent positioning while seated or standing supported by BUE. OT provided shower barrier to LLE and pt completed her shower with only min assist to wash buttocks thoroughly.    Pt returned to bed after shower for replacement of wet padding on TLSO and for assist with lower body dressing d/t fatigue from prolonged shower.   Pt left in bed with husband assisting with lower body dressing and hair care.     Therapy Documentation Precautions:  Precautions Precautions: Back, Fall Precaution Comments: lumbar fx (TLSO), multiple rib fxs, L tib  plateau fx (ORIF for 12/4), now in L bledsoe brace locked in ext Required Braces or Orthoses: Spinal Brace, Other Brace/Splint Spinal Brace: Thoracolumbosacral orthotic, Applied in supine position Other Brace/Splint: Bledsoe brace locked into extension Restrictions Weight Bearing Restrictions: Yes LLE Weight Bearing: Non weight bearing  Pain: Pain Assessment Pain Assessment: 0-10 Pain Score: 8  Pain Intervention(s): Medication (See eMAR)  ADL: ADL ADL Comments: see FIM   See FIM for current functional status  Therapy/Group: Individual Therapy  Gaelle Adriance 02/10/2014, 11:47 AM

## 2014-02-10 NOTE — Progress Notes (Signed)
ANTICOAGULATION CONSULT NOTE - Follow Up Consult  Pharmacy Consult for coumadin Indication: superficial thrombosis of saphenofemoral junction  No Active Allergies  Patient Measurements:   Heparin Dosing Weight:   Vital Signs: Temp: 97.9 F (36.6 C) (12/10 0624) Temp Source: Oral (12/10 0624) BP: 101/40 mmHg (12/10 0624) Pulse Rate: 67 (12/10 0624)  Labs:  Recent Labs  02/07/14 1925 02/08/14 0344 02/08/14 1700 02/09/14 0500 02/10/14 0600  HGB 7.0* 7.0*  --  7.1* 7.3*  HCT 21.1* 21.2*  --  22.2* 23.0*  PLT 284 290  --  341 392  LABPROT  --   --  14.3 14.7 16.4*  INR  --   --  1.10 1.14 1.31  CREATININE 0.45* 0.46*  --   --   --     Estimated Creatinine Clearance: 84.3 mL/min (by C-G formula based on Cr of 0.46).   Medications:  Scheduled:  . amitriptyline  25 mg Oral QHS  . buPROPion  150 mg Oral Daily  . coumadin book   Does not apply Once  . docusate sodium  100 mg Oral BID  . enoxaparin (LOVENOX) injection  30 mg Subcutaneous Q12H  . fentaNYL  25 mcg Transdermal Q72H  . LORazepam  1 mg Oral BID  . methocarbamol  1,000 mg Oral QID  . pantoprazole  40 mg Oral Daily  . polyethylene glycol  17 g Oral Daily  . warfarin   Does not apply Once  . Warfarin - Pharmacist Dosing Inpatient   Does not apply q1800  . zolpidem  5 mg Oral QHS   Infusions:    Assessment: 48 yo female with superficial thrombosis of saphenofemoral junction is currently on subtherapeutic coumadin but INR is up to 1.31 from 1.14.  Patient is also on lovenox 30 mg sq q12h.   Goal of Therapy:  INR 2-3 Monitor platelets by anticoagulation protocol: Yes   Plan:  Warfarin 5mg  PO tonight x1. D/c lovenox when INR >/= 2 Daily INR Continue to monitor H&H and platelets  Monitor s/sx of bleeding Educated  Vernesha Talbot, Tsz-Yin 02/10/2014,8:32 AM

## 2014-02-11 ENCOUNTER — Inpatient Hospital Stay (HOSPITAL_COMMUNITY): Payer: BC Managed Care – PPO | Admitting: *Deleted

## 2014-02-11 ENCOUNTER — Inpatient Hospital Stay (HOSPITAL_COMMUNITY): Payer: BC Managed Care – PPO

## 2014-02-11 LAB — CBC
HEMATOCRIT: 22.3 % — AB (ref 36.0–46.0)
HEMOGLOBIN: 7.2 g/dL — AB (ref 12.0–15.0)
MCH: 23.3 pg — AB (ref 26.0–34.0)
MCHC: 32.3 g/dL (ref 30.0–36.0)
MCV: 72.2 fL — ABNORMAL LOW (ref 78.0–100.0)
Platelets: 404 10*3/uL — ABNORMAL HIGH (ref 150–400)
RBC: 3.09 MIL/uL — AB (ref 3.87–5.11)
RDW: 19.3 % — ABNORMAL HIGH (ref 11.5–15.5)
WBC: 9.1 10*3/uL (ref 4.0–10.5)

## 2014-02-11 LAB — PROTIME-INR
INR: 1.82 — ABNORMAL HIGH (ref 0.00–1.49)
Prothrombin Time: 21.2 seconds — ABNORMAL HIGH (ref 11.6–15.2)

## 2014-02-11 MED ORDER — FENTANYL 25 MCG/HR TD PT72
25.0000 ug | MEDICATED_PATCH | TRANSDERMAL | Status: DC
  Administered 2014-02-11: 25 ug via TRANSDERMAL
  Filled 2014-02-11: qty 1

## 2014-02-11 MED ORDER — WARFARIN SODIUM 2.5 MG PO TABS
2.5000 mg | ORAL_TABLET | Freq: Once | ORAL | Status: AC
  Administered 2014-02-11: 2.5 mg via ORAL
  Filled 2014-02-11: qty 1

## 2014-02-11 NOTE — Progress Notes (Signed)
ANTICOAGULATION CONSULT NOTE - Follow Up Consult  Pharmacy Consult for coumadin Indication: superficial thrombosis of saphenofemoral junction  No Active Allergies  Patient Measurements:   Heparin Dosing Weight:   Vital Signs: Temp: 98.3 F (36.8 C) (12/11 0542) Temp Source: Oral (12/11 0542) BP: 111/52 mmHg (12/11 0542) Pulse Rate: 78 (12/11 0542)  Labs:  Recent Labs  02/09/14 0500 02/10/14 0600 02/11/14 0637  HGB 7.1* 7.3* 7.2*  HCT 22.2* 23.0* 22.3*  PLT 341 392 404*  LABPROT 14.7 16.4* 21.2*  INR 1.14 1.31 1.82*    Estimated Creatinine Clearance: 84.3 mL/min (by C-G formula based on Cr of 0.46).   Medications:  Scheduled:  . amitriptyline  25 mg Oral QHS  . buPROPion  150 mg Oral Daily  . coumadin book   Does not apply Once  . docusate sodium  100 mg Oral BID  . enoxaparin (LOVENOX) injection  30 mg Subcutaneous Q12H  . fentaNYL  25 mcg Transdermal Q72H  . LORazepam  1 mg Oral BID  . methocarbamol  1,000 mg Oral QID  . pantoprazole  40 mg Oral Daily  . warfarin   Does not apply Once  . Warfarin - Pharmacist Dosing Inpatient   Does not apply q1800  . zolpidem  5 mg Oral QHS   Infusions:    Assessment: 48 yo female with superficial thrombosis of saphenofemoral junction is currently on subtherapeutic coumadin but INR jumped to 1.82 from 1.31.  Hgb 7.2 and Plt 404 K stable.  Also on lovenox 30mg  sq q12h.  Goal of Therapy:  INR 2-3 Monitor platelets by anticoagulation protocol: Yes   Plan:  Warfarin 2.5mg  PO tonight x1 (may need 2.5 mg few days a week). D/c lovenox when INR >/= 2 Daily INR Continue to monitor H&H and platelets  Monitor s/sx of bleeding Educated  Elleni Mozingo, Tsz-Yin 02/11/2014,8:31 AM
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
## 2014-02-11 NOTE — Plan of Care (Signed)
Problem: SCI BOWEL ELIMINATION Goal: RH STG MANAGE BOWEL WITH ASSISTANCE STG Manage Bowel with Mod I Assistance.  Outcome: Progressing Goal: RH STG SCI MANAGE BOWEL WITH MEDICATION WITH ASSISTANCE STG SCI Manage bowel with medication with Min assistance.  Outcome: Progressing Goal: RH STG SCI MANAGE BOWEL PROGRAM W/ASSIST OR AS APPROPRIATE STG SCI Manage bowel program w/ Min assist or as appropriate.  Outcome: Progressing  Problem: RH SKIN INTEGRITY Goal: RH STG SKIN FREE OF INFECTION/BREAKDOWN No new skin breakdown with Min assist of caregiver  Outcome: Progressing Goal: RH STG MAINTAIN SKIN INTEGRITY WITH ASSISTANCE STG Maintain Skin Integrity With Min Assistance. For buttocks and left leg total assist,  Outcome: Progressing Goal: RH STG ABLE TO PERFORM INCISION/WOUND CARE W/ASSISTANCE STG Able To Perform Incision/Wound Care With Total Assistance of caregiver for left leg  Outcome: Progressing  Problem: RH SAFETY Goal: RH STG ADHERE TO SAFETY PRECAUTIONS W/ASSISTANCE/DEVICE STG Adhere to Safety Precautions With Supervision Assistance/Device.  Outcome: Progressing Goal: RH STG DECREASED RISK OF FALL WITH ASSISTANCE STG Decreased Risk of Fall With Supervision Assistance.  Outcome: Progressing  Problem: RH PAIN MANAGEMENT Goal: RH STG PAIN MANAGED AT OR BELOW PT'S PAIN GOAL 8 or less on scale of 1-10  Outcome: Progressing

## 2014-02-11 NOTE — Progress Notes (Addendum)
Occupational Therapy Session Note  Patient Details  Name: Alyssa Sanders MRN: [id number] Date of Birth: 11/15/1965  Today's Date: 02/11/2014 OT Individual Time: 0729-0829 OT Individual Time Calculation (min): 60 min    Short Term Goals: Week 1:  OT Short Term Goal 1 (Week 1): STG=LTG due to anticiapted short LOS  Skilled Therapeutic Interventions/Progress Updates: ADL-retraining with focus on adherence to back precautions during bed mobility, transfers, adapted bathing and dressing skills, and family ed (with husband) on w/c positioning.   Pt received supine in bed with husband attending to setup of breakfast tray.    Pt suspended self-feeding to participate in showering again this session as she reports reduction of pain during bathing.    With setup assist to don TLSO, pt completed bed mobility unassisted to rise to sitting at edge of bed.   Pt now requires only min guard assist during transfer to w/c using RW and has been managing pain in her left leg by repositioning using leg lifter, intermittently.   Pt completed similar transfer to tub bench with setup assist to apply shower barrier to pain patch and LLE.   Pt completes 90% of bathing but requires min assist to wash buttocks d/t adherence to no twisting precaution.   After showering pt fatigues rapidly and requires more assist with managing LLE and w/c but she is able to transfer to bed and allows her husband to continue assisting with all dressing as pt provides less effort at dressing herself due to fatigue and increased pain (lower back).   Husband was educated on need for grab bars and with request to provide a measurements of home to include bed height as he declared during this session that pt's bed is "much higher" than hospital bed.    Per pt., she uses a small step stool to get into her bed at home.   During pt ed, pt verbalized understanding of  OT recommendation  for modification to clothing choices to simplify dressing tasks however  she maintains interest in coordination of fabrics and colors currently available to her from home which includes pants that she cannot pull over her brace independently.    Pt required re-ed on no twisting precaution during her efforts at pain relief while supine at end of session and was left in bed with her husband attending to numerous requests for comfort.    Therapy Documentation Precautions:  Precautions Precautions: Back, Fall Precaution Comments: lumbar fx (TLSO), multiple rib fxs, L tib plateau fx (ORIF for 12/4), now in L bledsoe brace locked in ext Required Braces or Orthoses: Spinal Brace, Other Brace/Splint Spinal Brace: Thoracolumbosacral orthotic, Applied in supine position Other Brace/Splint: Bledsoe brace locked into extension Restrictions Weight Bearing Restrictions: Yes LLE Weight Bearing: Non weight bearing  Pain: Pain Assessment Pain Assessment: 0-10 Pain Score: 9  Pain Location: Back Pain Orientation: Lower;Right;Left Patients Stated Pain Goal: 7 Pain Intervention(s): Medication (See eMAR);Shower  ADL: ADL ADL Comments: see FIM  See FIM for current functional status  Therapy/Group: Individual Therapy  Kayline Sheer 02/11/2014, 9:42 AM
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
## 2014-02-11 NOTE — Progress Notes (Signed)
Physical Therapy Session Note  Patient Details  Name: Alyssa Sanders MRN: [id number] Date of Birth: 04/27/1965  Today's Date: 02/11/2014 PT Individual Time: 1400-1456 PT Individual Time Calculation (min): 56 min   Short Term Goals: Week 1:  PT Short Term Goal 1 (Week 1): Pt will tolerate upright standing for 1 minute PT Short Term Goal 2 (Week 1): Pt will ambulate 20' w/ RW and ModA PT Short Term Goal 3 (Week 1): Pt will perform sit<>stand w/ CGA PT Short Term Goal 4 (Week 1): Pt will negotiate up/down 2 stairs with ModA and B rails  Skilled Therapeutic Interventions/Progress Updates:    Patient received sitting in wheelchair. Session focused on wheelchair mobility, functional transfers, and LE strengthening. Wheelchair mobility >150' with B UE and supervision/cues for increased efficiency. Stand pivot transfers wheelchair<>mat with RW and min guard. Attempted to replace basic w/c cushion with pressure relieving cushion, however, patient states "I can just tell I won't like that cushion." In supine with wedge and pillows, patient performed LE therex: B ankle pumps and quad sets with 3" hold, R hip abd, R heel slides, L gastroc stretching. Patient returned to room and left sitting in recliner with B LEs elevated and pillows for comfort; ice applied to L knee and all needs within reach.  Therapy Documentation Precautions:  Precautions Precautions: Back, Fall Precaution Comments: lumbar fx (TLSO), multiple rib fxs, L tib plateau fx (ORIF for 12/4), now in L bledsoe brace locked in ext Required Braces or Orthoses: Spinal Brace, Other Brace/Splint Spinal Brace: Thoracolumbosacral orthotic, Applied in supine position Other Brace/Splint: Bledsoe brace locked into extension Restrictions Weight Bearing Restrictions: Yes LLE Weight Bearing: Non weight bearing Pain: Pain Assessment Pain Assessment: 0-10 Pain Score: 8  Pain Type: Surgical pain Pain Location: Generalized Pain Descriptors /  Indicators: Throbbing Pain Onset: On-going Pain Intervention(s): RN made aware;Repositioned;Ambulation/increased activity Multiple Pain Sites: No Locomotion : Ambulation Ambulation/Gait Assistance: Not tested (comment) Wheelchair Mobility Distance: 150   See FIM for current functional status  Therapy/Group: Individual Therapy  Chipper HerbBridget S Stephen Turnbaugh S. Sherol Sabas, PT, DPT 02/11/2014, 3:02 PM

## 2014-02-11 NOTE — Progress Notes (Signed)
Physical Therapy Session Note  Patient Details  Name: Alyssa Sanders MRN: [id number] Date of Birth: 10/01/1965  Today's Date: 02/11/2014 PT Individual Time:  9:05-10:10 (65min)    Short Term Goals: Week 1:  PT Short Term Goal 1 (Week 1): Pt will tolerate upright standing for 1 minute PT Short Term Goal 2 (Week 1): Pt will ambulate 20' w/ RW and ModA PT Short Term Goal 3 (Week 1): Pt will perform sit<>stand w/ CGA PT Short Term Goal 4 (Week 1): Pt will negotiate up/down 2 stairs with ModA and B rails  Skilled Therapeutic Interventions/Progress Updates:  Tx focused on therex for strengthening, functional mobility training, and gait with RW NWB LLE in Bledsoe brace. Pt with significant pain 10/10 in back and LE. Nursing gave pain patch and ice. Pt needed safety cues throughout for not twisting during gait/transfers while standing at RW  Pt instructed in supine, seated, and standing ex including each of the following x10-20 with rest breaks due to pain. Supine ankle pumps, glute sets, and gentle quad sets.  Seated WC push-ups 4x5 for increased UE strength for gait.  Static standing 2x1021min for increased activity tolerance with close S Standing L hip flex/ext at RW x10  WC propulsion 2x150' with distant S.   Gait training with RW and min A for steadying. Pt given cues and demo for vaulting over extended elbow rather than hopping. 1x8', 1x15, limited by pain.   Supine>sit with S; sit>supine end of tx with Min A. Pt requesting to get back in bed after orthotist came to assess TLSO fit for comfort. He suggested lowering periodoically through day due to body habitus more than trimming. Pt not like tight fit and wanting to go to bed due to increaed anxiety.      Therapy Documentation Precautions:  Precautions Precautions: Back, Fall Precaution Comments: lumbar fx (TLSO), multiple rib fxs, L tib plateau fx (ORIF for 12/4), now in L bledsoe brace locked in ext Required Braces or Orthoses:  Spinal Brace, Other Brace/Splint Spinal Brace: Thoracolumbosacral orthotic, Applied in supine position Other Brace/Splint: Bledsoe brace locked into extension Restrictions Weight Bearing Restrictions: Yes LLE Weight Bearing: Non weight bearing General:   Vital Signs: Therapy Vitals Temp: 98.3 F (36.8 C) Temp Source: Oral Pulse Rate: 78 Resp: 18 BP: (!) 111/52 mmHg Patient Position (if appropriate): Lying Oxygen Therapy SpO2: 97 % O2 Device: Not Delivered Pain: Pain Assessment Pain Assessment: 0-10 Pain Score: 9  Pain Location: Back Pain Orientation: Lower;Right;Left Patients Stated Pain Goal: 7 Pain Intervention(s): Medication (See eMAR);Shower  See FIM for current functional status  Therapy/Group: Individual Therapy  Clydene Lamingole Moiz Ryant, PT, DPT    Clydene LamingKAMPEN, Wendell Fiebig M 02/11/2014, 9:33 AM

## 2014-02-12 ENCOUNTER — Inpatient Hospital Stay (HOSPITAL_COMMUNITY): Payer: Self-pay | Admitting: *Deleted

## 2014-02-12 ENCOUNTER — Inpatient Hospital Stay (HOSPITAL_COMMUNITY): Payer: BC Managed Care – PPO | Admitting: *Deleted

## 2014-02-12 DIAGNOSIS — S82142S Displaced bicondylar fracture of left tibia, sequela: Secondary | ICD-10-CM

## 2014-02-12 DIAGNOSIS — D62 Acute posthemorrhagic anemia: Secondary | ICD-10-CM

## 2014-02-12 DIAGNOSIS — S32039G Unspecified fracture of third lumbar vertebra, subsequent encounter for fracture with delayed healing: Secondary | ICD-10-CM

## 2014-02-12 LAB — HEMOGLOBIN AND HEMATOCRIT, BLOOD
HEMATOCRIT: 22.5 % — AB (ref 36.0–46.0)
HEMOGLOBIN: 7.1 g/dL — AB (ref 12.0–15.0)

## 2014-02-12 LAB — PROTIME-INR
INR: 1.6 — ABNORMAL HIGH (ref 0.00–1.49)
Prothrombin Time: 19.2 seconds — ABNORMAL HIGH (ref 11.6–15.2)

## 2014-02-12 MED ORDER — FERROUS SULFATE 325 (65 FE) MG PO TABS
325.0000 mg | ORAL_TABLET | Freq: Three times a day (TID) | ORAL | Status: DC
  Administered 2014-02-12 – 2014-02-17 (×18): 325 mg via ORAL
  Filled 2014-02-12 (×19): qty 1

## 2014-02-12 MED ORDER — WARFARIN SODIUM 5 MG PO TABS
5.0000 mg | ORAL_TABLET | Freq: Once | ORAL | Status: AC
  Administered 2014-02-12: 5 mg via ORAL
  Filled 2014-02-12: qty 1
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
## 2014-02-12 NOTE — Progress Notes (Signed)
ANTICOAGULATION CONSULT NOTE - Follow Up Consult  Pharmacy Consult for coumadin Indication: superficial thrombosis of saphenofemoral junction  No Active Allergies  Patient Measurements:   Heparin Dosing Weight:   Vital Signs: Temp: 97.8 F (36.6 C) (12/12 0537) Temp Source: Oral (12/12 0537) BP: 107/53 mmHg (12/12 0537) Pulse Rate: 72 (12/12 0537)  Labs:  Recent Labs  02/10/14 0600 02/11/14 0637 02/12/14 0604  HGB 7.3* 7.2* 7.1*  HCT 23.0* 22.3* 22.5*  PLT 392 404*  --   LABPROT 16.4* 21.2* 19.2*  INR 1.31 1.82* 1.60*    Estimated Creatinine Clearance: 84.3 mL/min (by C-G formula based on Cr of 0.46).  Assessment: 48 yo female with superficial thrombosis of saphenofemoral junction is currently on subtherapeutic Coumadin with INR of 1.6. Hgb 7.1 and Plt 404 K-stable.  Also on lovenox 30mg  sq q12h.  Goal of Therapy:  INR 2-3 Monitor platelets by anticoagulation protocol: Yes   Plan:  -Warfarin 5mg  PO tonight x1 (may need 2.5 mg few days a week). D/c lovenox when INR >/= 2 -Daily INR -Continue to monitor H&H and platelets  -Monitor s/sx of bleeding   Essance Gatti D. Santosh Petter, PharmD, BCPS Clinical Pharmacist Pager: [phone] 02/12/2014 8:43 AM

## 2014-02-12 NOTE — Progress Notes (Signed)
Physical Therapy Session Note  Patient Details  Name: Alyssa Sanders MRN: [id number] Date of Birth: 08/25/1965  Today's Date: 02/12/2014 PT Missed Time: 30 Minutes Missed Time Reason: Pain  Short Term Goals: Week 1:  PT Short Term Goal 1 (Week 1): Pt will tolerate upright standing for 1 minute PT Short Term Goal 2 (Week 1): Pt will ambulate 20' w/ RW and ModA PT Short Term Goal 3 (Week 1): Pt will perform sit<>stand w/ CGA PT Short Term Goal 4 (Week 1): Pt will negotiate up/down 2 stairs with ModA and B rails  Skilled Therapeutic Interventions/Progress Updates:  Pt received in L sidelying, reporting high level of pain and that RN had just provided pain medicine. Pt declined to participate in 30 min skilled physical therapy session due to current pain level. Pt left in L sidelying with all needs in reach, bed alarm on and husband in room. Will continue per current POC as able.   Therapy Documentation Precautions:  Precautions Precautions: Back, Fall Precaution Comments: lumbar fx (TLSO), multiple rib fxs, L tib plateau fx (ORIF for 12/4), now in L bledsoe brace locked in ext Required Braces or Orthoses: Spinal Brace, Other Brace/Splint Spinal Brace: Thoracolumbosacral orthotic, Applied in supine position Other Brace/Splint: Bledsoe brace locked into extension Restrictions Weight Bearing Restrictions: Yes LLE Weight Bearing: Non weight bearing General: PT Amount of Missed Time (min): 30 Minutes PT Missed Treatment Reason: Pain Pain: Pain Assessment Pain Assessment: 0-10 Pain Score: 10-Worst pain ever Pain Type: Chronic pain Pain Location: Back Pain Orientation: Lower Pain Descriptors / Indicators: Shooting Pain Onset: On-going Pain Intervention(s): Medication (See eMAR)  See FIM for current functional status  Therapy/Group: Individual Therapy  Denzil HughesKing, Marrie Chandra S 02/12/2014, 12:01 PM

## 2014-02-12 NOTE — Progress Notes (Signed)
Physical Therapy Session Note  Patient Details  Name: Alyssa Sanders MRN: [id number] Date of Birth: 02/05/1966  Skilled Therapeutic Interventions/Progress Updates:  Attempted to make up missed time from earlier in day, however, pt supine in bed with continued complaints of high level of pain and politely declined participation. Pt stating that RN aware. Pt left semi-reclined in bed w/ all needs in reach, bed alarm on and husband in room.      Zerita BoersKing, Karrina Lye S 02/12/2014, 5:15 PM

## 2014-02-12 NOTE — Progress Notes (Signed)
Occupational Therapy Session Note  Patient Details  Name: Alyssa Sanders MRN: [id number] Date of Birth: 12/31/1965  Today's Date: 02/12/2014 OT Individual Time:  -    1300-1400   (60 min)  1st session                                               Short Term Goals: Week 1:  OT Short Term Goal 1 (Week 1): STG=LTG due to anticiapted short LOS  Skilled Therapeutic Interventions/Progress Updates:     1st session:  Pt. Lying in bed upon OT arrival.  Pt. Agreed to try to perform some activities  OOB.  Applied TLSO supine with pt rolling side to side. [facility]  Pt was SBA with transfer to 3n1.  Pt. Needed assistance with doffing pants and toilet hygiene.  Pt's husband assisted with toileting.  Pt transferred from toilet to bed with SBA and then to wc.  Propelled wc to gym.  Practiced AROM BUE to provide stretching and mobility.  Pt. Tolerated each arm but only one exercise.  Pt propelled wc back to room with family present.       Time:  1500-1615  (75 min)  2nd session Pain:9,5/.10 Individual session Pt.propelled wc from room towards atrium.  She propelled about 100 feet before needing to rest.  Propelled another 75 before resting.  Assisted pt to lobby of atrium.  Had pt propell wc on carpet which she did for 20 feet before tiring.  Went outside.  Pt refused to try to stand at half brick wall.  She propelled wc about 10 feet.  Assisted pt back to room.  Stood for 45 seconds with RW.  [facility].  Pt.  Donned/doffed pants with mod assist.  Did pericare x2 but husband went over again.  [facility]. [facility] and [facility]BA.  Positioned in bed with all needs in reach.     Therapy Documentation Precautions:  Precautions Precautions: Back, Fall Precaution Comments: lumbar fx (TLSO), multiple rib fxs, L tib plateau fx (ORIF for 12/4), now in L bledsoe brace locked in ext Required Braces or Orthoses: Spinal Brace, Other Brace/Splint Spinal  Brace: Thoracolumbosacral orthotic, Applied in supine position Other Brace/Splint: Bledsoe brace locked into extension Restrictions Weight Bearing Restrictions: Yes LLE Weight Bearing: Non weight bearing  Pain:  9.5/10   1st session  ADL: ADL ADL Comments: see FIM       See FIM for current functional status  Therapy/Group: Individual Therapy  Humberto Sealsdwards, Cleota Pellerito J 02/12/2014, 1:06 PM

## 2014-02-12 NOTE — Plan of Care (Signed)
Problem: RH PAIN MANAGEMENT Goal: RH STG PAIN MANAGED AT OR BELOW PT'S PAIN GOAL 8 or less on scale of 1-10  Outcome: Not Progressing MD aware of patient back pain. With medication pain only down to 9 out of 10.

## 2014-02-13 ENCOUNTER — Inpatient Hospital Stay (HOSPITAL_COMMUNITY): Payer: Self-pay

## 2014-02-13 DIAGNOSIS — S32039S Unspecified fracture of third lumbar vertebra, sequela: Secondary | ICD-10-CM

## 2014-02-13 LAB — HEMOGLOBIN AND HEMATOCRIT, BLOOD
HCT: 24 % — ABNORMAL LOW (ref 36.0–46.0)
Hemoglobin: 7.4 g/dL — ABNORMAL LOW (ref 12.0–15.0)

## 2014-02-13 LAB — PROTIME-INR
INR: 1.61 — ABNORMAL HIGH (ref 0.00–1.49)
Prothrombin Time: 19.3 seconds — ABNORMAL HIGH (ref 11.6–15.2)

## 2014-02-13 MED ORDER — HYDROMORPHONE HCL 2 MG PO TABS
4.0000 mg | ORAL_TABLET | ORAL | Status: DC | PRN
  Administered 2014-02-13 – 2014-02-16 (×15): 4 mg via ORAL
  Filled 2014-02-13 (×17): qty 2

## 2014-02-13 MED ORDER — FENTANYL 25 MCG/HR TD PT72
25.0000 ug | MEDICATED_PATCH | TRANSDERMAL | Status: DC
  Administered 2014-02-13 – 2014-02-16 (×3): 25 ug via TRANSDERMAL
  Filled 2014-02-13 (×3): qty 1

## 2014-02-13 MED ORDER — WARFARIN SODIUM 7.5 MG PO TABS
7.5000 mg | ORAL_TABLET | Freq: Once | ORAL | Status: AC
  Administered 2014-02-13: 7.5 mg via ORAL
  Filled 2014-02-13: qty 1

## 2014-02-13 NOTE — Progress Notes (Signed)
ANTICOAGULATION CONSULT NOTE - Follow Up Consult  Pharmacy Consult for coumadin Indication: superficial thrombosis of saphenofemoral junction  No Active Allergies  Patient Measurements:      Vital Signs: Temp: 97.9 F (36.6 C) (12/13 0500) Temp Source: Oral (12/13 0500) BP: 106/52 mmHg (12/13 0500) Pulse Rate: 69 (12/13 0500)  Labs:  Recent Labs  02/11/14 0637 02/12/14 0604 02/13/14 0440  HGB 7.2* 7.1* 7.4*  HCT 22.3* 22.5* 24.0*  PLT 404*  --   --   LABPROT 21.2* 19.2* 19.3*  INR 1.82* 1.60* 1.61*    Estimated Creatinine Clearance: 84.3 mL/min (by C-G formula based on Cr of 0.46).  Assessment: 48 yo female with superficial thrombosis of saphenofemoral junction is currently on subtherapeutic Coumadin with INR of 1.61. INR did made a large jump from 1.3>1.8, but has now plateaued. Hgb 7.4 and Plt 404 K-stable.  Also on lovenox 30mg  sq q12h.  Goal of Therapy:  INR 2-3 Monitor platelets by anticoagulation protocol: Yes   Plan:  -Warfarin 7.5mg  PO tonight x1. D/c lovenox when INR >/= 2 -Daily INR -Continue to monitor H&H and platelets  -Monitor s/sx of bleeding   Teneisha Gignac D. Demontrez Rindfleisch, PharmD, BCPS Clinical Pharmacist Pager: [phone] 02/13/2014 10:26 AM
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
## 2014-02-13 NOTE — Progress Notes (Signed)
Physical Therapy Session Note  Patient Details  Name: Alyssa Sanders MRN: [id number] Date of Birth: 12/26/1965  Today's Date: 02/13/2014 PT Individual Time: 1430-1530 PT Individual Time Calculation (min): 60 min   Short Term Goals: Week 1:  PT Short Term Goal 1 (Week 1): Pt will tolerate upright standing for 1 minute PT Short Term Goal 2 (Week 1): Pt will ambulate 20' w/ RW and ModA PT Short Term Goal 3 (Week 1): Pt will perform sit<>stand w/ CGA PT Short Term Goal 4 (Week 1): Pt will negotiate up/down 2 stairs with ModA and B rails  Skilled Therapeutic Interventions/Progress Updates:    Pt received seated in w/c, agreeable to participate in therapy. Session focused on discharge planning, education on home setup, home entry. Much of session spent in discussion with pt, husband, and pt's high school age dtr. Pt feels that due to her intense back pain since she initiated gait training that she will not be able to meet ambulation goals prior to discharge. Pt complains that because she does not have enough upper body strength to unweight R foot, lift and place it down for gait and instead has to hop with the foot, that this jarring irritates her compression fracture in her lower back and has caused increased lower back and radiating pain down both of her legs (she has had problems in past with sciatica. Due to this, pt would like to change her plan of care to discharge at wheelchair level. Pt informed that this will require two family members to bump her up/down 3 stairs to enter and exit her home. Pt and family agreeable to this plan. Will continue to discuss with case manager and treatment team tomorrow to clarify discharge plans. Remainder of session focused on setting up bedroom for stand pivot transfers bed<>w/c. Pt has 30" high bed at home and previously required stepstool to get in bed. Recommend that box spring be removed to reduce height and allow for ease of transfers. Also recommend setting  up furniture so that pt can stand pivot transfer to R both in and out of bed for improved safety with transfer. Pt able to direct care for stand pivot transfer and wheelchair parts management with min question cueing. Pt left seated in w/c w/ family preparing to walk outside.    Therapy Documentation Precautions:  Precautions Precautions: Back, Fall Precaution Comments: lumbar fx (TLSO), multiple rib fxs, L tib plateau fx (ORIF for 12/4), now in L bledsoe brace locked in ext Required Braces or Orthoses: Spinal Brace, Other Brace/Splint Spinal Brace: Thoracolumbosacral orthotic, Applied in supine position Other Brace/Splint: Bledsoe brace locked into extension Restrictions Weight Bearing Restrictions: Yes LLE Weight Bearing: Non weight bearing Pain: Pain Assessment Pain Assessment: 0-10 Pain Score: 9  (9.5) Pain Type: Chronic pain Pain Location: Back Pain Orientation: Lower Pain Descriptors / Indicators: Shooting Pain Onset: On-going Pain Intervention(s): Repositioned, pt received pain medication after session  See FIM for current functional status  Therapy/Group: Individual Therapy  Hosie SpangleGodfrey, Kyndall Chaplin  Hosie SpangleJess Lizvette Lightsey, PT, DPT 02/13/2014, 7:53 AM

## 2014-02-13 NOTE — Plan of Care (Signed)
Problem: RH PAIN MANAGEMENT Goal: RH STG PAIN MANAGED AT OR BELOW PT'S PAIN GOAL 8 or less on scale of 1-10  Outcome: Not Progressing Pain remains at 9 out of 10 with PRN/scheduled pain medication

## 2014-02-14 ENCOUNTER — Encounter (HOSPITAL_COMMUNITY): Payer: Self-pay | Admitting: Orthopaedic Surgery

## 2014-02-14 ENCOUNTER — Inpatient Hospital Stay (HOSPITAL_COMMUNITY): Payer: BC Managed Care – PPO

## 2014-02-14 LAB — HEMOGLOBIN AND HEMATOCRIT, BLOOD
HCT: 24.6 % — ABNORMAL LOW (ref 36.0–46.0)
Hemoglobin: 7.8 g/dL — ABNORMAL LOW (ref 12.0–15.0)

## 2014-02-14 LAB — PROTIME-INR
INR: 2.1 — AB (ref 0.00–1.49)
Prothrombin Time: 23.7 seconds — ABNORMAL HIGH (ref 11.6–15.2)

## 2014-02-14 MED ORDER — WARFARIN SODIUM 5 MG PO TABS
5.0000 mg | ORAL_TABLET | Freq: Once | ORAL | Status: AC
  Administered 2014-02-14: 5 mg via ORAL
  Filled 2014-02-14: qty 1

## 2014-02-14 MED ORDER — GABAPENTIN 100 MG PO CAPS
100.0000 mg | ORAL_CAPSULE | Freq: Three times a day (TID) | ORAL | Status: DC
  Administered 2014-02-14 – 2014-02-15 (×6): 100 mg via ORAL
  Filled 2014-02-14 (×10): qty 1

## 2014-02-14 NOTE — Progress Notes (Signed)
Occupational Therapy Session Note  Patient Details  Name: Alyssa Sanders MRN: [id number] Date of Birth: 09/25/1965  Today's Date: 02/14/2014 OT Individual Time: [id number] OT Individual Time Calculation (min): 60 min    Short Term Goals: Week 1:  OT Short Term Goal 1 (Week 1): STG=LTG due to anticiapted short LOS  Skilled Therapeutic Interventions/Progress Updates: ADL-retraining with focus on improved efficiency and independence with bathing/dressing.    Pt received seated in w/c, receptive for bathing with husband attending for training.   Pt completed transfer to bench and is now able to manage both legs on tub bench while wearing TLSO.   Pt is able to bathe thoroughly unassisted but continues to request assist with washing buttocks more thoroughly and her husband accomodates as needed.   Pt completes dressing at w/c level and in bed d/t need for change of wet TLSO pads, requiring no less than moderate assist to dress lower body d/t fatigue after shower and inability to manage dressing over left leg d/t brace.   OT educated pt's husband on how to change pads this session.   Dressing goals downgraded this date due to no further progress and accomodation to patient's limitations by her husband who reports he is willing to assist with tasks and confirms plan to assist pt with bath in evening versus mornings.  Therapy Documentation Precautions:  Precautions Precautions: Back, Fall Precaution Comments: lumbar fx (TLSO), multiple rib fxs, L tib plateau fx (ORIF for 12/4), now in L bledsoe brace locked in ext Required Braces or Orthoses: Spinal Brace, Other Brace/Splint Spinal Brace: Thoracolumbosacral orthotic, Applied in supine position Other Brace/Splint: Bledsoe brace locked into extension Restrictions Weight Bearing Restrictions: Yes LLE Weight Bearing: Non weight bearing  Vital Signs: Therapy Vitals Temp: 97.5 F (36.4 C) Temp Source: Oral Pulse Rate: 79 Resp: 18 Oxygen  Therapy SpO2: 100 %   Pain: Pain Assessment Pain Score: 9   ADL: ADL ADL Comments: see FIM  See FIM for current functional status  Therapy/Group: Individual Therapy   Second session: Time: 1300-1400 Time Calculation (min):  60 min  Pain Assessment:  9/10, low back, no change in status; pt able to perform activities  Skilled Therapeutic Interventions: Therapeutic activity with focus on discharge planning, functional transfers, problem-solving, and re-training on use of AE to manage LE and extend reach.   Pt aware of need to practice transfers simulating home environment with husband present.   Pt attempted transfer from w/c to loveseat at ADL apt and after repeated trials, agreed to perform SPT using RW rather than attempt management of w/c with leg extension.   Pt is able to tolerate LLE in dependent position for short periods of time and use furniture available to elevate leg using leg lifter to reposition leg as needed.    Pt was able to rise from low seated position on love seat to RW to return to complete return transfer to w/c again after brief rest break.   Per discussion with pt and husband, new plan is to rearrange furniture to limit transfer to red recliner, allowing pt to elevate legs as needed for improved pain management while at home alone.    Social worker visited during session and confirmed plan to provide tub bench and drop arm commode.  After transfer training pt was escorted back to her room with husband attending to all needs.  See FIM for current functional status  Therapy/Group: Individual Therapy  Alyssa Sanders 02/14/2014, 2:29 PM

## 2014-02-14 NOTE — Progress Notes (Signed)
Physical Therapy Session Note  Patient Details  Name: Alyssa Sanders MRN: [id number] Date of Birth: 07/30/1965  Today's Date: 02/14/2014 PT Individual Time: 0930-1000 PT Individual Time Calculation (min): 30 min  and Today's Date: 02/14/2014 PT Missed Time: 30 Minutes Missed Time Reason: Xray  Short Term Goals: Week 1:  PT Short Term Goal 1 (Week 1): Pt will tolerate upright standing for 1 minute PT Short Term Goal 2 (Week 1): Pt will ambulate 20' w/ RW and ModA PT Short Term Goal 3 (Week 1): Pt will perform sit<>stand w/ CGA PT Short Term Goal 4 (Week 1): Pt will negotiate up/down 2 stairs with ModA and B rails  Skilled Therapeutic Interventions/Progress Updates:    At scheduled therapy time pt off unit for x-ray. Pt missed 30 minutes scheduled PT time. Remainder of session focused on bed mobility, functional endurance, car transfers. Pt rolled L w/ SBA and R w/ MinA to manage RLE to don TLSO, pt able to direct care w/ min cueing. Stand pivot transfer w/ RW bed>w/c w/ min steadying assist. Pt propelled w/c 100' then 150' to ortho gym w/ SBA to negotiate environment due to extended leg rest. Discussed car transfers with pt and husband, pt would benefit from being able to transfer to bench seat due to difficulty managing RLE while locked in extended knee brace into car. However, pt's primary vehicle has captain chairs in front and back seats. Pt able to complete stand pivot transfer to practice car at same height as primary vehicle w/ steadying assist. While staying within estimated bounds of captain's chair, pt unable to get RLE into car due to extension. Discussed with pt and husband attempting car transfer with family car tomorrow to see if there is enough room for pt to get RLE into car. Pt transported back to room w/ TotalA for time management. Session ended in pt's room, where she was left seated in w/c w/ husband present and all needs within reach.  Therapy Documentation Precautions:   Precautions Precautions: Back, Fall Precaution Comments: lumbar fx (TLSO), multiple rib fxs, L tib plateau fx (ORIF for 12/4), now in L bledsoe brace locked in ext Required Braces or Orthoses: Spinal Brace, Other Brace/Splint Spinal Brace: Thoracolumbosacral orthotic, Applied in supine position Other Brace/Splint: Bledsoe brace locked into extension Restrictions Weight Bearing Restrictions: Yes LLE Weight Bearing: Non weight bearing Pain: 10/10, pt received pain medication from RN during session.  See FIM for current functional status  Therapy/Group: Individual Therapy  Hosie SpangleGodfrey, Aleese Kamps  Hosie SpangleJess Lyndee Herbst, PT, DPT 02/14/2014, 7:46 AM

## 2014-02-14 NOTE — Plan of Care (Signed)
Problem: SCI BOWEL ELIMINATION Goal: RH STG MANAGE BOWEL WITH ASSISTANCE STG Manage Bowel with Mod I Assistance.  Outcome: Progressing LBM 02/14/2014  Problem: RH PAIN MANAGEMENT Goal: RH STG PAIN MANAGED AT OR BELOW PT'S PAIN GOAL 8 or less on scale of 1-10  Outcome: Not Progressing Pain remains at 10

## 2014-02-14 NOTE — Progress Notes (Signed)
ANTICOAGULATION CONSULT NOTE - Follow Up Consult  Pharmacy Consult for coumadin Indication: superficial thrombosis of saphenofemoral junction  No Active Allergies  Patient Measurements:      Vital Signs: Temp: 98 F (36.7 C) (12/14 0621) Temp Source: Oral (12/14 0621) BP: 108/46 mmHg (12/14 0621) Pulse Rate: 69 (12/14 0621)  Labs:  Recent Labs  02/12/14 0604 02/13/14 0440 02/14/14 0720  HGB 7.1* 7.4* 7.8*  HCT 22.5* 24.0* 24.6*  LABPROT 19.2* 19.3* 23.7*  INR 1.60* 1.61* 2.10*    Estimated Creatinine Clearance: 84.3 mL/min (by C-G formula based on Cr of 0.46).  Assessment: 48 yo female with superficial thrombosis of saphenofemoral junction is currently on Coumadin. INR of 1.61 >2.1. INR did made a large jump from 1.3>1.8, but has now plateaued. Hgb 7.4 and Plt 404 K-stable.  Also on lovenox 30mg  sq q12h.  Goal of Therapy:  INR 2-3 Monitor platelets by anticoagulation protocol: Yes   Plan:  -Warfarin 5mg  PO tonight x1.  - D/c lovenox since INR > 2 - Daily INR - Continue to monitor H&H and platelets  - Monitor s/sx of bleeding  Bayard HuggerMei Shakai Dolley, PharmD, BCPS  Clinical Pharmacist  Pager: [phone]  02/14/2014 11:35 AM

## 2014-02-14 NOTE — Plan of Care (Signed)
Problem: RH Dressing Goal: LTG Patient will perform lower body dressing w/assist (OT) LTG: Patient will perform lower body dressing with assist, with/without cues in positioning using equipment (OT)  Goal downgraded d/t assist needed with brace - FB

## 2014-02-14 NOTE — Plan of Care (Signed)
Problem: RH Dressing Goal: LTG Patient will perform upper body dressing (OT) LTG Patient will perform upper body dressing with assist, with/without cues (OT).  Goal downgraded d/t assist with TLSO - FB
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
## 2014-02-15 ENCOUNTER — Inpatient Hospital Stay (HOSPITAL_COMMUNITY): Payer: BC Managed Care – PPO

## 2014-02-15 LAB — PROTIME-INR
INR: 2.3 — AB (ref 0.00–1.49)
Prothrombin Time: 25.5 seconds — ABNORMAL HIGH (ref 11.6–15.2)

## 2014-02-15 MED ORDER — WARFARIN SODIUM 5 MG PO TABS
5.0000 mg | ORAL_TABLET | Freq: Once | ORAL | Status: AC
  Administered 2014-02-15: 5 mg via ORAL
  Filled 2014-02-15: qty 1
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
## 2014-02-15 NOTE — Progress Notes (Signed)
Occupational Therapy Weekly Progress Note  Patient Details  Name: Alyssa Sanders MRN: [id number] Date of Birth: 01/02/1966  Beginning of progress report period: February 08, 2014 End of progress report period: February 15, 2014  Today's Date: 02/15/2014 OT Individual Time: [id number] OT Individual Time Calculation (min): 60 min    Patient has progressed toward achieving 1 of 1 short term goals, modified d/t need for assist with managing TLSO and LLE brace.    Patient continues to demonstrate the following deficits: Low back and LLE pain, impaired dynamic sitting balance, BUE weakness and therefore will continue to benefit from skilled OT intervention to enhance overall performance with BADL.  Patient not progressing toward long term goals.  See goal revision..  Plan of care revisions: Dressing goals downgraded d/t need for set-up assist with orthotics use..  OT Short Term Goals Week 1:  OT Short Term Goal 1 (Week 1): STG=LTG due to anticiapted short LOS Week 2:  OT Short Term Goal 1 (Week 2): Pt will demo ability to direct family effectively to assist with functional transfer from bed to w/c and back OT Short Term Goal 2 (Week 2): Pt will completed toilet hygiene using AE with satisfactory thoroughness OT Short Term Goal 3 (Week 2): Pt will complete transfer on/off BSC independently using leg lifter, prn, to manage LLE OT Short Term Goal 4 (Week 2): Pt will demo ability to manage w/c in simulated home environment independently to transition from bed to chair/sofa  Skilled Therapeutic Interventions/Progress Updates: ADL-retraining with focus on family ed and training on bed mobility with bed raised to 29" and bed rails lowered, family ed training on assist with donning/doffing TLSO.   Pt completed transfer from w/c to elevated bed, using bed to elevate LLE while performing anterior bed mobility toward center of bed using arms and buttocks to weight-shift unassisted.   Pt able to direct  husband to assist with lifting her leg however she failed to change position as needed to assume side lying and instead reclined to supine from sitting, provoking increased lower back pain.   Pt required mod assist to reposition and relieve symptoms and extra time to recover to continue treatment and practice.    During second attempt, pt and husband worked more closely and improved communication skills to facilitate a less stressful transfer.   Pt and husband demonstrated improved awareness of priorities with treatment to focus on improved bed mobility, w/c management (including leg rests), and home mods.    Pt reported this date improved efficiency with toilet hygiene using tongs provided to extend reach.   OT also advised use of flushable wet wipes to improve thoroughness.    Pt returned to w/c at end of session to await physical therapist.        Therapy Documentation Precautions:  Precautions Precautions: Back, Fall Precaution Comments: lumbar fx (TLSO), multiple rib fxs, L tib plateau fx (ORIF for 12/4), now in L bledsoe brace locked in ext Required Braces or Orthoses: Spinal Brace, Other Brace/Splint Spinal Brace: Thoracolumbosacral orthotic, Applied in supine position Other Brace/Splint: Bledsoe brace locked into extension Restrictions Weight Bearing Restrictions: Yes LLE Weight Bearing: Non weight bearing  Pain: Pain Assessment Pain Assessment: 0-10 Pain Score: 9   ADL: ADL ADL Comments: see FIM  See FIM for current functional status  Therapy/Group: Individual Therapy   Second session: Time: 1300-1400 Time Calculation (min): 60 min  Pain Assessment: 9/10, lower back  Skilled Therapeutic Interventions: ADL-retraining with focus on family ed (husband)  on home safety set-up, pt training on unassisted transfers using leg lifter and weight shifting, w/c management, pain management, and energy conservation.   Pt received seated in w/c and emotionally labile due to low back pain  with her husband attending to her needs.   With max verbal cues and redirection to focus on goals of therapy, pt able to call for meds (ultram) and participate in pre-discharge problem-solving and planning session using ADL apartment context to simulate home environment.    After escort to apartment, pt was able to negotiate w/c and RW to complete transfer to bed and bring both legs into bed to assume side lying on left, unassisted.   Pt then required min assist to reposition LLE from side lying to supine d/t fatigue/pain but demonstrated good problem-solving with husband relative to changing her room configuration, reducing furnishing, and involving daughter in set-up assist.   Pt acknowledged dependence on bed rails for bed mobility and was educated on use of bed transfer handle and similar attachments available to standard bed/frame, among other strategies.    Pt completed session and requested assist with toilet transfer and toileting.   [facility] using RW with standby assist, voided urine, and was educated on alternate clothing choices to improve efficiency with clothing management.    Pt left on BSC at end of session with husband assisting, prn.       See FIM for current functional status  Therapy/Group: Individual Therapy  Alyssa Sanders 02/15/2014, 12:52 PM

## 2014-02-15 NOTE — Progress Notes (Signed)
ANTICOAGULATION CONSULT NOTE - Follow Up Consult  Pharmacy Consult for coumadin Indication: superficial thrombosis of saphenofemoral junction  No Active Allergies  Patient Measurements:      Vital Signs: Temp: 98 F (36.7 C) (12/15 0452) Temp Source: Oral (12/15 0452) BP: 95/79 mmHg (12/15 0456) Pulse Rate: 88 (12/15 0456)  Labs:  Recent Labs  02/13/14 0440 02/14/14 0720 02/15/14 0818  HGB 7.4* 7.8*  --   HCT 24.0* 24.6*  --   LABPROT 19.3* 23.7* 25.5*  INR 1.61* 2.10* 2.30*    CrCl cannot be calculated (Unknown ideal weight.).  Assessment: 48 yo female with superficial thrombosis of saphenofemoral junction is currently on Coumadin. INR therapeutic at 2.3 today. Hgb 7.8 trending up slightly. Sq lovenox D/C'd  Goal of Therapy:  INR 2-3 Monitor platelets by anticoagulation protocol: Yes   Plan:  -Warfarin 5mg  PO tonight x1.  - Daily INR - Continue to monitor H&H and platelets  - Monitor s/sx of bleeding  Bayard HuggerMei Roma Bondar, PharmD, BCPS  Clinical Pharmacist  Pager: [phone]  02/15/2014 1:10 PM

## 2014-02-15 NOTE — Progress Notes (Signed)
Physical Therapy Session Note  Patient Details  Name: Alyssa Sanders MRN: [id number] Date of Birth: 06/12/1965  Today's Date: 02/15/2014 PT Individual Time: 0900-1000 PT Individual Time Calculation (min): 60 min   Short Term Goals: Week 1:  PT Short Term Goal 1 (Week 1): Pt will tolerate upright standing for 1 minute PT Short Term Goal 2 (Week 1): Pt will ambulate 20' w/ RW and ModA PT Short Term Goal 3 (Week 1): Pt will perform sit<>stand w/ CGA PT Short Term Goal 4 (Week 1): Pt will negotiate up/down 2 stairs with ModA and B rails  Skilled Therapeutic Interventions/Progress Updates:    Pt received supine in bed, agreeable to participate in therapy. Session focused on functional endurance, car transfer, community mobility. Pt moved supine<>sit w/ MinA to manage LLE from flat bed w/ no rails to simulate home environment. SPT w/ RW and steadying assist-close S bed<>w/c. Pt propelled w/c up to 200' to navigate down to 1st floor lobby, propelled w/c 50' on community surfaces (uneven sidewalks). Practiced car transfer w/ pt and husband in family's car. Pt able to perform car transfer w/ RW and +2 MinA, with assist to walk hips back on seat in order to clear LLE into car, and assist to bring LLE into car. In this therapist's clinical judgement pt will be able to perform transfer w/ MinA of 1 with husband assisting LLE in/out of car. Pt had to take one hop step forwards while transferring back to w/c, reported intense back pain after transfer. Pt transported back to rehab unit via TotalA for time management. Session ended in pt's room, left supine in bed with all needs within reach.   Therapy Documentation Precautions:  Precautions Precautions: Back, Fall Precaution Comments: lumbar fx (TLSO), multiple rib fxs, L tib plateau fx (ORIF for 12/4), now in L bledsoe brace locked in ext Required Braces or Orthoses: Spinal Brace, Other Brace/Splint Spinal Brace: Thoracolumbosacral orthotic, Applied in  supine position Other Brace/Splint: Bledsoe brace locked into extension Restrictions Weight Bearing Restrictions: Yes LLE Weight Bearing: Non weight bearing Pain: Pain Assessment Pain Assessment: 0-10 Pain Score: 9  Pain Type: Acute pain Pain Location: Back Pain Orientation: Lower Pain Descriptors / Indicators: Shooting Pain Frequency: Constant Pain Onset: On-going Patients Stated Pain Goal: 4 Pain Intervention(s): pt received medication from RN prior to session  See FIM for current functional status  Therapy/Group: Individual Therapy  Hosie SpangleGodfrey, Lexxie Winberg  Hosie SpangleJess Higinio Grow, PT, DPT 02/15/2014, 7:36 AM
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
## 2014-02-15 NOTE — Plan of Care (Signed)
Problem: RH Ambulation Goal: LTG Patient will ambulate in controlled environment (PT) LTG: Patient will ambulate in a controlled environment, # of feet with assistance (PT).  Outcome: Not Met (add Reason) Goal D/C'ed due to continued pain, lack of functional progress. Pt agreeable to discharge at wheelchair level. Goal: LTG Patient will ambulate in home environment (PT) LTG: Patient will ambulate in home environment, # of feet with assistance (PT).  Outcome: Not Met (add Reason) Goal D/C'ed due to continued pain, lack of functional progress. Pt agreeable to discharge at wheelchair level.  Problem: RH Stairs Goal: LTG Patient will ambulate up and down stairs w/assist (PT) LTG: Patient will ambulate up and down # of stairs with assistance (PT)  Goal downgraded due to continued pain, lack of patient progress.

## 2014-02-15 NOTE — Progress Notes (Signed)
Patient states Deatra InaDan Angiulli, PA was to come back and speak with her today.  Patient states she will leave tomorrow if changes are not made to her pain medication.  Deatra Inaan Angiulli, PA notified of patient complaints regarding pain medication and threat to leave AMA.  PA aware and will pass on the concerns to Dr. Riley KillSwartz.  Will continue to monitor.

## 2014-02-15 NOTE — Progress Notes (Signed)
Social Work Patient ID: Alyssa Sanders, female   DOB: 1965-04-18, 48 y.o.   MRN: [id number] Met with pt and husband to discuss team conference progression toward goals and discharge 12/18.  They are pleased with plan and pt will be ready to leave here. Husband has been here and learning pt's care.

## 2014-02-15 NOTE — Progress Notes (Signed)
Patient complaining that her current pain medications are not adequately controlling her pain.  Patient in tears stating "Dr. Riley KillSwartz won't listen to me."  Deatra Inaan Angiulli, PA notified of patient complaints; PA to see patient.  Will continue to monitor.

## 2014-02-15 NOTE — Progress Notes (Signed)
Patient's pain remains 9/10, lower back that radiates to legs "shooting" discomfort,  throughout shift after PRN medication admin and repositioning . Applied ice to the LLE. Diamantina MonksARPENTER,Nicey Krah, RN

## 2014-02-16 ENCOUNTER — Inpatient Hospital Stay (HOSPITAL_COMMUNITY): Payer: Self-pay

## 2014-02-16 ENCOUNTER — Inpatient Hospital Stay (HOSPITAL_COMMUNITY): Payer: BC Managed Care – PPO | Admitting: *Deleted

## 2014-02-16 ENCOUNTER — Encounter (HOSPITAL_COMMUNITY): Payer: Self-pay

## 2014-02-16 ENCOUNTER — Inpatient Hospital Stay (HOSPITAL_COMMUNITY): Payer: Self-pay | Admitting: Occupational Therapy

## 2014-02-16 LAB — PROTIME-INR
INR: 1.93 — ABNORMAL HIGH (ref 0.00–1.49)
PROTHROMBIN TIME: 22.2 s — AB (ref 11.6–15.2)

## 2014-02-16 MED ORDER — SENNOSIDES-DOCUSATE SODIUM 8.6-50 MG PO TABS
2.0000 | ORAL_TABLET | Freq: Every day | ORAL | Status: DC
Start: 2014-02-16 — End: 2014-02-17
  Administered 2014-02-16: 2 via ORAL
  Filled 2014-02-16: qty 2

## 2014-02-16 MED ORDER — WARFARIN SODIUM 7.5 MG PO TABS
7.5000 mg | ORAL_TABLET | Freq: Once | ORAL | Status: AC
  Administered 2014-02-16: 7.5 mg via ORAL
  Filled 2014-02-16: qty 1

## 2014-02-16 MED ORDER — OXYCODONE HCL 5 MG PO TABS
5.0000 mg | ORAL_TABLET | ORAL | Status: DC | PRN
  Administered 2014-02-16 – 2014-02-17 (×6): 5 mg via ORAL
  Filled 2014-02-16 (×8): qty 1

## 2014-02-16 MED ORDER — OXYCODONE-ACETAMINOPHEN 5-325 MG PO TABS
1.0000 | ORAL_TABLET | ORAL | Status: DC | PRN
  Administered 2014-02-16 – 2014-02-17 (×7): 1 via ORAL
  Filled 2014-02-16 (×7): qty 1

## 2014-02-16 MED ORDER — GABAPENTIN 300 MG PO CAPS
300.0000 mg | ORAL_CAPSULE | Freq: Three times a day (TID) | ORAL | Status: DC
  Administered 2014-02-16 – 2014-02-17 (×5): 300 mg via ORAL
  Filled 2014-02-16 (×9): qty 1

## 2014-02-16 NOTE — Progress Notes (Signed)
Occupational Therapy Session Note  Patient Details  Name: Alyssa Sanders MRN: [id number] Date of Birth: 10/26/1965  Today's Date: 02/16/2014 OT Individual Time: 1000-1100 OT Individual Time Calculation (min): 60 min    Short Term Goals: Week 2:  OT Short Term Goal 1 (Week 2): Pt will demo ability to direct family effectively to assist with functional transfer from bed to w/c and back OT Short Term Goal 2 (Week 2): Pt will completed toilet hygiene using AE with satisfactory thoroughness OT Short Term Goal 3 (Week 2): Pt will complete transfer on/off BSC independently using leg lifter, prn, to manage LLE OT Short Term Goal 4 (Week 2): Pt will demo ability to manage w/c in simulated home environment independently to transition from bed to chair/sofa  Skilled Therapeutic Interventions/Progress Updates:    Pt rested in w/c upon arrival and requested to take shower this morning.  Pt directed care appropriately and performed transfers with supervision.  Pt required assistance threading pants on LLE but was able to pull up pants rolling side to side in bed.  Pt performed BSC transfer with supervision and used AE appropriately for hygiene.  Focus on activity tolerance, transfers, AE use, sit<>stand, standing balance, bed mobility, and safety awareness.  Therapy Documentation Precautions:  Precautions Precautions: Back, Fall Precaution Comments: lumbar fx (TLSO), multiple rib fxs, L tib plateau fx (ORIF for 12/4), now in L bledsoe brace locked in ext Required Braces or Orthoses: Spinal Brace, Other Brace/Splint Spinal Brace: Thoracolumbosacral orthotic, Applied in supine position Other Brace/Splint: Bledsoe brace locked into extension Restrictions Weight Bearing Restrictions: Yes LLE Weight Bearing: Non weight bearing  Pain: Pain Assessment Pain Assessment: 0-10 Pain Score: 9  Pain Type: Chronic pain Pain Location: Back Pain Orientation: Lower Pain Radiating Towards: left leg Pain  Descriptors / Indicators: Shooting Pain Frequency: Constant Pain Onset: On-going Patients Stated Pain Goal: 4 Pain Intervention(s): RN aware; repositioned ADL: ADL ADL Comments: see FIM  See FIM for current functional status  Therapy/Group: Individual Therapy  Rich BraveLanier, [name] 02/16/2014, 12:09 PM

## 2014-02-16 NOTE — Progress Notes (Signed)
ANTICOAGULATION CONSULT NOTE - Follow Up Consult  Pharmacy Consult for coumadin Indication: superficial thrombosis of saphenofemoral junction  No Active Allergies  Patient Measurements:      Vital Signs: Temp: 98 F (36.7 C) (12/16 0526) Temp Source: Oral (12/16 0526) BP: 104/44 mmHg (12/16 0526) Pulse Rate: 71 (12/16 0526)  Labs:  Recent Labs  02/14/14 0720 02/15/14 0818 02/16/14 0617  HGB 7.8*  --   --   HCT 24.6*  --   --   LABPROT 23.7* 25.5* 22.2*  INR 2.10* 2.30* 1.93*    CrCl cannot be calculated (Unknown ideal weight.).  Assessment: 48 yo female with superficial thrombosis of saphenofemoral junction is currently on Coumadin. INR slightly subtherapeutic at 1.93 today. Hgb 7.8 trending up slightly. Sq lovenox D/C'd  Goal of Therapy:  INR 2-3 Monitor platelets by anticoagulation protocol: Yes   Plan:  -Warfarin 7.5mg  PO tonight x1.  - Daily INR - Continue to monitor H&H and platelets  - Monitor s/sx of bleeding  Bayard HuggerMei Damary Doland, PharmD, BCPS  Clinical Pharmacist  Pager: [phone]  02/16/2014 11:55 AM

## 2014-02-16 NOTE — Discharge Summary (Signed)
Discharge summary job # [id number]

## 2014-02-16 NOTE — Progress Notes (Addendum)
Occupational Therapy Session Note  Patient Details  Name: Alyssa Sanders MRN: [id number] Date of Birth: 02/11/1966  Today's Date: 02/16/2014 OT Individual Time: 1410-1445 OT Individual Time Calculation (min): 35 min    Short Term Goals: Week 1:  OT Short Term Goal 1 (Week 1): STG=LTG due to anticiapted short LOS  Skilled Therapeutic Interventions/Progress Updates:    Pt seen for make up session.  Engaged in therapeutic activity with focus on transfers in home environment.  Pt reporting concern of falling upon return to home with transfers.  Discussed setup of home environment and recommendation of use of BSC in living room if home alone for short periods of time, as she reports that she would spend most time there.  Pt also with questions about transferring from toilet to shower at home, setup w/c in ADL apt bathroom next to shower (set up with w/c as toilet) and tub bench over shower ledge.  Performed transfer from w/c to tub bench with use of RW and pivoting on Rt foot, required min assist due to fear of falling and distance for w/c to tub bench.  Pt tearful at end of session due to frustration about transfer.  Upon returning to room pt [facility] performing stand pivot [facility] with RW and close supervision.  Therapy Documentation Precautions:  Precautions Precautions: Back, Fall Precaution Comments: lumbar fx (TLSO), multiple rib fxs, L tib plateau fx (ORIF for 12/4), now in L bledsoe brace locked in ext Required Braces or Orthoses: Spinal Brace, Other Brace/Splint Spinal Brace: Thoracolumbosacral orthotic, Applied in supine position Other Brace/Splint: Bledsoe brace locked into extension Restrictions Weight Bearing Restrictions: Yes LLE Weight Bearing: Non weight bearing Pain: Pain Assessment Pain Assessment: 0-10 Pain Score: 10-Worst pain ever Pain Type: Chronic pain Pain Location: Back Pain Orientation: Lower Pain Radiating Towards:  left leg Pain Descriptors / Indicators: Shooting Pain Frequency: Constant Pain Onset: On-going Patients Stated Pain Goal: 4 Pain Intervention(s): Medication (See eMAR) ADL: ADL ADL Comments: see FIM  See FIM for current functional status  Therapy/Group: Individual Therapy  Rosalio LoudHOXIE, Dmari Schubring 02/16/2014, 3:53 PM

## 2014-02-16 NOTE — Progress Notes (Addendum)
Recreational Therapy Session Note  Patient Details  Name: Alyssa Sanders MRN: [id number] Date of Birth: Oct 05, 1965 Today's Date: 02/16/2014  Pain: c/o 9.5/10 back, leg, hip pain, RN aware & providing medication  Pt referred by team for relaxation training.  Discussion with pt and husband 12/10 including leisure screen.  Encouraged husband to bring in crochet materials as pt stated this is enjoyable & relaxing for her.  Met with pt again today with focus on stress management techniques, specifically deep breathing techniques.  Discussion included verbal instructions & demonstration.  Pt appreciated information & plans to use post discharge.  No further TR implemented.  Therapy/Group: Individual Therapy   Quinlan Mcfall 02/16/2014, 4:42 PM
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
## 2014-02-16 NOTE — Progress Notes (Signed)
Physical Therapy Session Note  Patient Details  Name: Alyssa Sanders MRN: [id number] Date of Birth: 06/21/1965  Today's Date: 02/16/2014 PT Individual Time: 0900-1000 PT Individual Time Calculation (min): 60 min   Session 2 Time: 1300-1400 Time Calculation (min): 60  Short Term Goals: Week 1:  PT Short Term Goal 1 (Week 1): Pt will tolerate upright standing for 1 minute PT Short Term Goal 2 (Week 1): Pt will ambulate 20' w/ RW and ModA PT Short Term Goal 3 (Week 1): Pt will perform sit<>stand w/ CGA PT Short Term Goal 4 (Week 1): Pt will negotiate up/down 2 stairs with ModA and B rails  Skilled Therapeutic Interventions/Progress Updates:    Session 1: Pt received seated in w/c, agreeable to participate in therapy. Session focused on bed mobility, LLE strengthening, functional endurance. Pt propelled w/c 50' w/ BUE before needing to rest due to fatigue. Transferred w/c>mat table w/ setup assist and close S during transfer, Min A to manage LLE when moving stand>sit for pain management. Blocked practice supine<>sit w/ overall MinA to manage LLE. Pt able to roll supine<>L sidelying with log roll technique while maintaining precautions with MinA. Pt w/ 2x10 ankle circles/ankle pumps to maintain ROM in L ankle, then x10 quad sets to maintain strength in L quad. Pt performed SPT mat table>w/c w/ mat table raised to 29" (height of pt's bed at home) w/ overall close S. Pt transported back to room via w/c, left seated in w/c w/ all needs within reach.   Session 2: Pt received seated EOB handed off from RN, agreeable to participate in therapy. Session focused on education about anatomy of spine and origin of neuropathic pain, fitting new equipment, bed mobility and stand pivot transfers. Provided handout to pt regarding bumping up/down stairs with wheelchair, pt asked to read over handout with husband and come up with any questions they have. Time spent going over anatomy of spine with visual references  to further educate pt on back pain and neuropathic pain. For functional endurance pt propelled w/c 150' to/from rehab gym w/ SBA, required one rest break on return trip. Pt performed multiple stand pivot transfers w/c<>mat table set at 29" (pt's bed height at home). Pt able to safely get hips back on bed with strategy of heel raising on R foot, pushing with B hands on RW, and pushing w/ R foot on side brace of RW. Pt progressed to only requiring assist to steady RW. No longer recommend home modification for pt to get in/out of bed. Session ended in pt's room, where pt was left seated in w/c w/ all needs within reach.    Therapy Documentation Precautions:  Precautions Precautions: Back, Fall Precaution Comments: lumbar fx (TLSO), multiple rib fxs, L tib plateau fx (ORIF for 12/4), now in L bledsoe brace locked in ext Required Braces or Orthoses: Spinal Brace, Other Brace/Splint Spinal Brace: Thoracolumbosacral orthotic, Applied in supine position Other Brace/Splint: Bledsoe brace locked into extension Restrictions Weight Bearing Restrictions: Yes LLE Weight Bearing: Non weight bearing Pain: Pain Assessment Pain Assessment: 0-10 Pain Score: 9  Pain Type: Chronic pain;Acute pain Pain Location: Back Pain Orientation: Lower;Posterior Pain Intervention(s): Pt received pain medication from RN during session.  See FIM for current functional status  Therapy/Group: Individual Therapy  Hosie SpangleGodfrey, Charlita Brian  Hosie SpangleJess Kynlei Piontek, PT, DPT 02/16/2014, 7:41 AM

## 2014-02-17 ENCOUNTER — Inpatient Hospital Stay (HOSPITAL_COMMUNITY): Payer: BC Managed Care – PPO

## 2014-02-17 ENCOUNTER — Inpatient Hospital Stay (HOSPITAL_COMMUNITY): Payer: BC Managed Care – PPO | Admitting: Physical Therapy

## 2014-02-17 ENCOUNTER — Inpatient Hospital Stay (HOSPITAL_COMMUNITY): Payer: Self-pay

## 2014-02-17 ENCOUNTER — Encounter (HOSPITAL_COMMUNITY): Payer: Self-pay

## 2014-02-17 DIAGNOSIS — F419 Anxiety disorder, unspecified: Secondary | ICD-10-CM

## 2014-02-17 LAB — PROTIME-INR
INR: 1.93 — ABNORMAL HIGH (ref 0.00–1.49)
Prothrombin Time: 22.2 seconds — ABNORMAL HIGH (ref 11.6–15.2)

## 2014-02-17 MED ORDER — FENTANYL 25 MCG/HR TD PT72: 25.0000 ug | MEDICATED_PATCH | TRANSDERMAL | Status: DC

## 2014-02-17 MED ORDER — WARFARIN SODIUM 5 MG PO TABS: 5.0000 mg | ORAL_TABLET | Freq: Every day | ORAL | Status: DC

## 2014-02-17 MED ORDER — POLYETHYLENE GLYCOL 3350 17 G PO PACK: 17.0000 g | PACK | Freq: Every day | ORAL | Status: AC

## 2014-02-17 MED ORDER — METHOCARBAMOL 500 MG PO TABS: 1000.0000 mg | ORAL_TABLET | Freq: Four times a day (QID) | ORAL | Status: DC

## 2014-02-17 MED ORDER — FERROUS SULFATE 325 (65 FE) MG PO TABS: 325.0000 mg | ORAL_TABLET | Freq: Three times a day (TID) | ORAL | Status: DC

## 2014-02-17 MED ORDER — ZOLMITRIPTAN 5 MG PO TABS: 5.0000 mg | ORAL_TABLET | ORAL | Status: AC | PRN

## 2014-02-17 MED ORDER — WARFARIN SODIUM 5 MG PO TABS: ORAL_TABLET | ORAL | Status: DC

## 2014-02-17 MED ORDER — SENNOSIDES-DOCUSATE SODIUM 8.6-50 MG PO TABS: 2.0000 | ORAL_TABLET | Freq: Every day | ORAL | Status: DC

## 2014-02-17 MED ORDER — OXYCODONE-ACETAMINOPHEN 10-325 MG PO TABS: 1.0000 | ORAL_TABLET | Freq: Four times a day (QID) | ORAL | Status: DC | PRN

## 2014-02-17 MED ORDER — ZOLPIDEM TARTRATE 10 MG PO TABS: 5.0000 mg | ORAL_TABLET | Freq: Every evening | ORAL | Status: AC | PRN

## 2014-02-17 MED ORDER — GABAPENTIN 300 MG PO CAPS: 300.0000 mg | ORAL_CAPSULE | Freq: Three times a day (TID) | ORAL | Status: DC

## 2014-02-17 MED ORDER — POLYETHYLENE GLYCOL 3350 17 G PO PACK
17.0000 g | PACK | Freq: Every day | ORAL | Status: DC
  Administered 2014-02-17: 17 g via ORAL
  Filled 2014-02-17 (×2): qty 1

## 2014-02-17 MED ORDER — WARFARIN SODIUM 7.5 MG PO TABS
7.5000 mg | ORAL_TABLET | Freq: Once | ORAL | Status: AC
  Administered 2014-02-17: 7.5 mg via ORAL
  Filled 2014-02-17: qty 1

## 2014-02-17 MED ORDER — METHOCARBAMOL 500 MG PO TABS: 1000.0000 mg | ORAL_TABLET | Freq: Four times a day (QID) | ORAL | Status: AC | PRN

## 2014-02-17 MED ORDER — PANTOPRAZOLE SODIUM 40 MG PO TBEC: 40.0000 mg | DELAYED_RELEASE_TABLET | Freq: Every day | ORAL | Status: DC

## 2014-02-17 MED ORDER — AMITRIPTYLINE HCL 25 MG PO TABS: 25.0000 mg | ORAL_TABLET | Freq: Every day | ORAL | Status: DC

## 2014-02-17 NOTE — Progress Notes (Signed)
Patient given Rx for duragesic patch 25 mc/hr # 10 patches. Percocet 10/325mg  # 120 pills. Ambien 5mg  # 30  Pills. Robaxin 500 mg # 200 pills.
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
## 2014-02-17 NOTE — Progress Notes (Signed)
Occupational Therapy Session Note  Patient Details  Name: Vassie LollDebbie A Houp MRN: [id number] Date of Birth: 01/15/1966  Today's Date: 02/17/2014 OT Individual Time: 5621-30860730-0830 OT Individual Time Calculation (min): 60 min    Short Term Goals: Week 2:  OT Short Term Goal 1 (Week 2): Pt will demo ability to direct family effectively to assist with functional transfer from bed to w/c and back OT Short Term Goal 2 (Week 2): Pt will completed toilet hygiene using AE with satisfactory thoroughness OT Short Term Goal 3 (Week 2): Pt will complete transfer on/off BSC independently using leg lifter, prn, to manage LLE OT Short Term Goal 4 (Week 2): Pt will demo ability to manage w/c in simulated home environment independently to transition from bed to chair/sofa  Skilled Therapeutic Interventions/Progress Updates: ADL-retraining at shower level with focus on family ed and training and transfer to bed from w/c placed at head of bed.   Pt received seated on BSC with TLSO on and prepared to shower.   Pt able to manage toilet hygiene using tongs independently and reports plan to use flushable wipes as advised for improved thoroughness.   With husband assisting, [facility] and w/c to tub bench with overall standby assist for safety.   Pt bathed using LH sponge with min assist to thoroughly wash buttocks.   Pt returned to bedside from w/c and performed stand-pivot transfer to bed elevated to 29" using RW with husband providing contact guard.      Pt was able to bring legs into bed this session using leg lifter although benefiting from continuous setup from husband who anticipated her need for placement of pillows as she repositioned in bed.   Pt demo's excellent ability to direct caregivers during BADL.   Although she is able to lace her right underpants and pant legs using reacher, and can pull pants over right hip while side lying, pt allows her husband to perform all lower body dressing  tasks to conserve her energy and minimize back pain while rolling to left and right.   Pt dresses in bed after bathing and is unable complete self-care in 60 minute timeframe without assistance d/t fatigue and aggravation of pain.    Therapy Documentation Precautions:  Precautions Precautions: Back, Fall Precaution Comments: lumbar fx (TLSO), multiple rib fxs, L tib plateau fx (ORIF for 12/4), now in L bledsoe brace locked in ext Required Braces or Orthoses: Spinal Brace, Other Brace/Splint Spinal Brace: Thoracolumbosacral orthotic, Applied in supine position Other Brace/Splint: Bledsoe brace locked into extension Restrictions Weight Bearing Restrictions: Yes LLE Weight Bearing: Non weight bearing  Pain: Pain Assessment Pain Assessment: 0-10 Pain Score: 9 Pain Type: Surgical pain Pain Location: Back Pain Descriptors / Indicators: Aching;Radiating (down  Lleg) Pain Onset: On-going Pain Intervention(s): Rest;Medication (See eMAR) Multiple Pain Sites: No  ADL: ADL ADL Comments: see FIM  See FIM for current functional status  Therapy/Group: Individual Therapy   Second session: Time: 1300-1330 Time Calculation (min):  30 min  Pain Assessment: 9/10, lower back  Skilled Therapeutic Interventions: ADL-retraining with focus on family education and training on shower transfers, simulating home bathroom configuration.    Pt received supine in bed, TLSO off, in discussion with family member on her phone.   Pt and husband reported frustration with change to discharge day/time but after brief service recovery, pt agreed to participate in shower transfer as discussed during earlier session.   Using tub bench and ADL apartment bathroom, pt experimented  with 2 arrangements of bench: 1 away from wall (where shower head is mounted) and 1 near wall.   OT educated pt's husband on positioning as help and after performing transfer to tub bench away from wall, pt discovered that her left leg was not  fully supported by bench.   Pt elected to use stand pivot transfer using RW from toilet as most secure method rather than from w/c.   Pt was returned to her room at end of session with husband and daughter, Joice Loftsmber, attending to her needs.   See FIM for current functional status  Therapy/Group: Individual Therapy  Zariel Capano 02/17/2014, 10:51 AM

## 2014-02-17 NOTE — Progress Notes (Signed)
Social Work Patient ID: Alyssa Sanders, female   DOB: 12/07/1965, 48 y.o.   MRN: [id number] Per MD pt will be discharged to[facility] after her therapies.  She has all of her DME and follow up is arranged and [place]

## 2014-02-17 NOTE — Plan of Care (Signed)
Problem: RH Bed Mobility Goal: LTG Patient will perform bed mobility with assist (PT) LTG: Patient will perform bed mobility with assistance, with/without cues (PT).  Outcome: Not Met (add Reason) Pt req min A for L LE management in/out of bed.

## 2014-02-17 NOTE — Progress Notes (Signed)
Occupational Therapy Discharge Summary  Patient Details  Name: Alyssa Sanders MRN: [id number] Date of Birth: 06-Apr-1965   Patient has met 7 of 8 long term goals due to improved activity tolerance, improved balance, postural control, ability to compensate for deficits and improved awareness.  Patient to discharge at overall Supervision level.  Patient's care partner is independent to provide the necessary physical and cognitive assistance at discharge for supervised bathing and shower transfers and assisted dressing and adherence to NWB and back precautions with use of TLSO and Bledsoe brace.    Reasons goals not met: Pt elected to wear coordinated clothing to maintain desired appearance rather than loose-fitting casual wear recommended to improve independence with dressing.   Husband is willing to provide assist needed to insure pt remains satisfied with her appearance.  Recommendation:  Patient will benefit from ongoing skilled OT services in home health setting to continue to advance functional skills in the area of BADL and iADL.  Equipment: Bath bench, drop-arm BSC  Reasons for discharge: discharge from hospital  Patient/family agrees with progress made and goals achieved: Yes  OT Discharge Precautions/Restrictions  Precautions Precautions: Back;Fall Precaution Comments: lumbar fx (TLSO), multiple rib fxs, L tib plateau fx (ORIF for 12/4), now in L bledsoe brace locked in ext Required Braces or Orthoses: Spinal Brace;Other Brace/Splint (L bledsoe brace locked in extension) Spinal Brace: Thoracolumbosacral orthotic;Applied in supine position Other Brace/Splint: Bledsoe brace locked into extension Restrictions Weight Bearing Restrictions: Yes LLE Weight Bearing: Non weight bearing  Vital Signs Therapy Vitals Temp: 97.6 F (36.4 C) Temp Source: Oral Pulse Rate: 82 Resp: 17 BP: (!) 111/45 mmHg Patient Position (if appropriate): Sitting Oxygen Therapy SpO2: 94 % O2 Device:  Not Delivered  Pain Pain Assessment Pain Assessment: 0-10 Pain Score: 10-Worst pain ever Pain Type: Chronic pain Pain Location: Back Pain Orientation: Lower Pain Radiating Towards: left leg Pain Descriptors / Indicators: Aching;Sharp;Shooting Pain Frequency: Constant Pain Onset: On-going Patients Stated Pain Goal: 4 Pain Intervention(s): Medication (See eMAR)  ADL ADL ADL Comments: see FIM  Vision/Perception  Vision- History Baseline Vision/History: Wears glasses Patient Visual Report: No change from baseline Vision- Assessment Vision Assessment?: No apparent visual deficits Perception Comments: wfl   Cognition Overall Cognitive Status: Within Functional Limits for tasks assessed Arousal/Alertness: Awake/alert Orientation Level: Oriented X4 Attention: Focused;Sustained Focused Attention: Appears intact Sustained Attention: Appears intact Memory: Appears intact Awareness: Appears intact Problem Solving: Appears intact Behaviors: Other (comment) (anxious) Safety/Judgment: Appears intact  Sensation Sensation Light Touch: Appears Intact Stereognosis: Appears Intact Hot/Cold: Appears Intact Proprioception: Appears Intact Additional Comments: WFL @ BUE  Coordination Gross Motor Movements are Fluid and Coordinated: Yes Fine Motor Movements are Fluid and Coordinated: Yes Coordination and Movement Description: G/FMC WFL at [place]  Motor  Motor Motor: Within Functional Limits Motor - Discharge Observations: antalgic and slowed movements due to pain  Mobility  Bed Mobility Bed Mobility: Supine to Sit;Sit to Supine Supine to Sit: 4: Min guard Supine to Sit Details: Manual facilitation for placement Supine to Sit Details (indicate cue type and reason): assist to L LE Sit to Supine: 4: Min guard Sit to Supine - Details: Manual facilitation for placement Sit to Supine - Details (indicate cue type and reason): assist to L LE Transfers Transfers: Sit to Stand;Stand to  Sit Sit to Stand: 5: Supervision Sit to Stand Details (indicate cue type and reason): for safety Stand to Sit: 5: Supervision Stand to Sit Details: for safety   Trunk/Postural Assessment  Cervical Assessment Cervical Assessment:  Within Functional Limits Thoracic Assessment Thoracic Assessment: Exceptions to [facility] Thoracic AROM Overall Thoracic AROM: Deficits;Due to precautions Overall Thoracic AROM Comments: TLSO brace limits movement Lumbar Assessment Lumbar Assessment: Exceptions to [facility] Lumbar AROM Overall Lumbar AROM: Deficits;Due to precautions Postural Control Postural Control: Deficits on evaluation Trunk Control: limited by TLSO, back precautions   Balance Balance Balance Assessed: Yes Static Sitting Balance Static Sitting - Balance Support: No upper extremity supported;Feet unsupported Static Sitting - Level of Assistance: 7: Independent Dynamic Sitting Balance Dynamic Sitting - Balance Support: Feet unsupported;Left upper extremity supported;Right upper extremity supported Dynamic Sitting - Level of Assistance: 6: Modified independent (Device/Increase time) Static Standing Balance Static Standing - Balance Support: Bilateral upper extremity supported;During functional activity Static Standing - Level of Assistance: 5: Stand by assistance Dynamic Standing Balance Dynamic Standing - Balance Support: Bilateral upper extremity supported;During functional activity Dynamic Standing - Level of Assistance: 5: Stand by assistance Dynamic Standing - Balance Activities: Lateral lean/weight shifting;Forward lean/weight shifting  Extremity/Trunk Assessment RUE Assessment RUE Assessment: Within Functional Limits LUE Assessment LUE Assessment: Within Functional Limits  See FIM for current functional status  Jonetta Dagley 02/17/2014, 3:06 PM

## 2014-02-17 NOTE — Plan of Care (Signed)
Problem: RH Bed Mobility Goal: LTG Patient will perform bed mobility with assist (PT) LTG: Patient will perform bed mobility with assistance, with/without cues (PT).  Outcome: Not Met (add Reason) Pt continues to req min A for L LE management.   Problem: RH Ambulation Goal: LTG Patient will ambulate in controlled environment (PT) LTG: Patient will ambulate in a controlled environment, # of feet with assistance (PT).  Outcome: Not Met (add Reason) Pt refuses ambulation due to pain from spinal compression fracture. Goal: LTG Patient will ambulate in home environment (PT) LTG: Patient will ambulate in home environment, # of feet with assistance (PT).  Outcome: Not Met (add Reason) Pt refuses ambulation due to pain from spinal compression fracture.  Problem: RH Wheelchair Mobility Goal: LTG Patient will propel w/c in community environment (PT) LTG: Patient will propel wheelchair in community environment, # of feet with assist (PT)  Outcome: Not Met (add Reason) Pt only tolerated propelling manual w/c 50' in a community environment.

## 2014-02-17 NOTE — Progress Notes (Signed)
Social Work Patient ID: Alyssa Sanders, female   DOB: 1965-05-06, 48 y.o.   MRN: [id number] Met with pt who has decided she would like a rental hospital bed for home.  Ordered via [facility] unsure if can deliver today. Pt is aware and will try this at home and hope it works for her.
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
## 2014-02-17 NOTE — Progress Notes (Signed)
Social Work Discharge Note Discharge Note  The overall goal for the admission was met for:   Discharge location: Yes-HOME WITH HUSBAND WHO CAN PROVIDE 24 HR CARE  Length of Stay: Yes-10 DAYS  Discharge activity level: Yes-SUPERVISION/MOD/I LEVEL  Home/community participation: Yes  Services provided included: MD, RD, PT, OT, SLP, RN, CM, Pharmacy and SW  Financial Services: Private Insurance: MED PAY and Other: BCBS  Follow-up services arranged: Home Health: [facility]-PT,OT,RN, DME: [facility]-WHEELCHAIR,DROP-ARM BSC, TUB BENCH ,YOUTH ROLLING WALKER and Patient/Family has no preference for HH/DME agencies  Comments (or additional information):HUSBAND HAS [place], GLAD TO [place]  Patient/Family verbalized understanding of follow-up arrangements: Yes  Individual responsible for coordination of the follow-up plan: PATIENT & HUSBAND  Confirmed correct DME delivered: Elease Hashimoto 02/17/2014    Elease Hashimoto

## 2014-02-17 NOTE — Progress Notes (Signed)
ANTICOAGULATION CONSULT NOTE - Follow Up Consult  Pharmacy Consult for coumadin Indication: superficial thrombosis of saphenofemoral junction  No Active Allergies  Patient Measurements: Weight: 190 lb 11.2 oz (86.501 kg)    Vital Signs: Temp: 98.4 F (36.9 C) (12/17 0416) Temp Source: Oral (12/17 0416) BP: 103/42 mmHg (12/17 0416) Pulse Rate: 74 (12/17 0416)  Labs:  Recent Labs  02/15/14 0818 02/16/14 0617 02/17/14 0558  LABPROT 25.5* 22.2* 22.2*  INR 2.30* 1.93* 1.93*    Estimated Creatinine Clearance: 85 mL/min (by C-G formula based on Cr of 0.46).  Assessment: 48 yo female with superficial thrombosis of saphenofemoral junction is currently on Coumadin. INR remains slightly subtherapeutic at 1.93 today. No new cbc, No bleeding noted per chart. Possible discharge tomorrow.  Goal of Therapy:  INR 2-3 Monitor platelets by anticoagulation protocol: Yes   Plan:  -Warfarin 7.5mg  PO tonight x1.  - Daily INR - Continue to monitor H&H and platelets  - Monitor s/sx of bleeding - if goes home recommend 5mg  daily except 7.5 mg on Wednesday.  Bayard HuggerMei Cable Fearn, PharmD, BCPS  Clinical Pharmacist  Pager: [phone]  02/17/2014 12:46 PM

## 2014-02-17 NOTE — Progress Notes (Signed)
Patient and family received discharge instructions from Pam Love, PA-C with verbal understanding. Patient discharged to home with family and belongings. 

## 2014-02-17 NOTE — Progress Notes (Signed)
Physical Therapy Session Note  Patient Details  Name: Alyssa Sanders MRN: [id number] Date of Birth: 09/13/1965  Today's Date: 02/17/2014 PT Individual Time: 0830-0930 PT Individual Time Calculation (min): 60 min   Short Term Goals: Week 2:    STGs = LTGs  Skilled Therapeutic Interventions/Progress Updates:    Therapeutic Activity: Husband demonstrates safe ability to assist pt in bed mobility req min A for L LE during supine to/from sit and in providing Supervision during w/c to/from bed transfer with RW.  PT adjusts pt's own RW and switches out shorter legs for the posterior pegs and pt demonstrates a w/c to/from bed transfer with this personal walker and reports the height will work out just fine.  PT instructs pt in car transfer req min A for  LLE management in/out of car and PT problem solves techniques to ease room for leg clearance with Bledsoe brace locked in extension.   W/C Management: Pt self propels manual w/c in a controlled environment mod I x 250'. Pt continues to require assist with L brake and L legrest management due to pain and TLSO brace, limiting pt's trunk mobility and ability to reach those w/c parts.    Pt refuses to ambulate due to compression fracture in spine - she wiggles her foot during transfer to move. Pt continues to c/o high levels of pain throughout PT session, but RN notified and came in to give pt pain medication. Pt req prolonged rest breaks due to c/o high pain levels. Son Alyssa PilgrimJacob, 49 y/o, plans on coming in for training in bumping pt up/down stairs in w/c. Husband and daughter have already been trained, but in a simulated bumping pt up/down stairs, but without pt in w/c. Continue per PT POC.   Therapy Documentation Precautions:  Precautions Precautions: Back, Fall Precaution Comments: lumbar fx (TLSO), multiple rib fxs, L tib plateau fx (ORIF for 12/4), now in L bledsoe brace locked in ext Required Braces or Orthoses: Spinal Brace, Other  Brace/Splint Spinal Brace: Thoracolumbosacral orthotic, Applied in supine position Other Brace/Splint: Bledsoe brace locked into extension Restrictions Weight Bearing Restrictions: Yes LLE Weight Bearing: Touchdown weight bearing  Pain: Pain Assessment Pain Assessment: 0-10 Pain Score: 10-Worst pain ever Pain Type: Surgical pain Pain Location: Back Pain Orientation: Left Pain Descriptors / Indicators: Aching;Radiating (down  Lleg) Pain Onset: On-going Pain Intervention(s): Rest;Medication (See eMAR) Multiple Pain Sites: No  See FIM for current functional status  Therapy/Group: Individual Therapy  Alyssa Sanders M 02/17/2014, 8:49 AM
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
## 2014-02-18 ENCOUNTER — Encounter (HOSPITAL_COMMUNITY): Payer: Self-pay

## 2014-02-18 ENCOUNTER — Inpatient Hospital Stay (HOSPITAL_COMMUNITY): Payer: Self-pay

## 2014-02-18 NOTE — Progress Notes (Signed)
Social Work  Discharge Note  The overall goal for the admission was met for:   Discharge location: Yes - home with husband and family to provide 24/7 assistance  Length of Stay: Yes - 10 days  Discharge activity level: Yes - supervision/ min assist w/c level  Home/community participation: Yes  Services provided included: MD, RD, PT, OT, RN, TR, Pharmacy, Neuropsych and SW  Financial Services: Private Insurance: [employer] [place]  Follow-up services arranged: Home Health: RN, PT, OT via [place], DME: [phone] lightweight w/c with ELRs, cuahion, rolling walker, hospital bed, drop arm commode, tub bench via [place] and Patient/Family has no preference for HH/DME agencies  Comments (or additional information):  Patient/Family verbalized understanding of follow-up arrangements: Yes  Individual responsible for coordination of the follow-up plan: patient  Confirmed correct DME delivered: Sharmane Dame 02/18/2014    [place], [place]
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
## 2014-03-11 DIAGNOSIS — I82412 Acute embolism and thrombosis of left femoral vein: Secondary | ICD-10-CM | POA: Diagnosis not present

## 2014-03-11 DIAGNOSIS — S32038D Other fracture of third lumbar vertebra, subsequent encounter for fracture with routine healing: Secondary | ICD-10-CM | POA: Diagnosis not present

## 2014-03-11 DIAGNOSIS — S2241XD Multiple fractures of ribs, right side, subsequent encounter for fracture with routine healing: Secondary | ICD-10-CM | POA: Diagnosis not present

## 2014-03-11 DIAGNOSIS — F329 Major depressive disorder, single episode, unspecified: Secondary | ICD-10-CM

## 2014-03-11 DIAGNOSIS — F419 Anxiety disorder, unspecified: Secondary | ICD-10-CM

## 2014-03-11 DIAGNOSIS — Z7901 Long term (current) use of anticoagulants: Secondary | ICD-10-CM

## 2014-03-11 DIAGNOSIS — Z5181 Encounter for therapeutic drug level monitoring: Secondary | ICD-10-CM

## 2014-03-11 DIAGNOSIS — S82142D Displaced bicondylar fracture of left tibia, subsequent encounter for closed fracture with routine healing: Secondary | ICD-10-CM | POA: Diagnosis not present

## 2014-05-31 ENCOUNTER — Ambulatory Visit: Payer: Self-pay | Admitting: Family Medicine

## 2014-07-04 DIAGNOSIS — F329 Major depressive disorder, single episode, unspecified: Secondary | ICD-10-CM

## 2014-07-04 DIAGNOSIS — I82412 Acute embolism and thrombosis of left femoral vein: Secondary | ICD-10-CM | POA: Diagnosis not present

## 2014-07-04 DIAGNOSIS — Z5181 Encounter for therapeutic drug level monitoring: Secondary | ICD-10-CM

## 2014-07-04 DIAGNOSIS — S32038D Other fracture of third lumbar vertebra, subsequent encounter for fracture with routine healing: Secondary | ICD-10-CM | POA: Diagnosis not present

## 2014-07-04 DIAGNOSIS — F419 Anxiety disorder, unspecified: Secondary | ICD-10-CM

## 2014-07-04 DIAGNOSIS — Z7901 Long term (current) use of anticoagulants: Secondary | ICD-10-CM

## 2014-07-04 DIAGNOSIS — S82142D Displaced bicondylar fracture of left tibia, subsequent encounter for closed fracture with routine healing: Secondary | ICD-10-CM | POA: Diagnosis not present

## 2014-07-04 DIAGNOSIS — S2241XD Multiple fractures of ribs, right side, subsequent encounter for fracture with routine healing: Secondary | ICD-10-CM | POA: Diagnosis not present

## 2014-07-27 ENCOUNTER — Encounter (HOSPITAL_COMMUNITY): Payer: Self-pay | Admitting: *Deleted

## 2014-07-27 ENCOUNTER — Other Ambulatory Visit (HOSPITAL_COMMUNITY): Payer: Self-pay | Admitting: Orthopaedic Surgery

## 2014-07-28 ENCOUNTER — Ambulatory Visit (HOSPITAL_COMMUNITY): Payer: BLUE CROSS/BLUE SHIELD | Admitting: Anesthesiology

## 2014-07-28 ENCOUNTER — Ambulatory Visit (HOSPITAL_COMMUNITY)
Admission: RE | Admit: 2014-07-28 | Discharge: 2014-07-28 | Disposition: A | Discharge status: Home / Self Care | Payer: BLUE CROSS/BLUE SHIELD | Source: Ambulatory Visit | Attending: Orthopaedic Surgery | Admitting: Orthopaedic Surgery

## 2014-07-28 ENCOUNTER — Encounter (HOSPITAL_COMMUNITY)
Admission: RE | Disposition: A | Discharge status: Home / Self Care | Payer: Self-pay | Source: Ambulatory Visit | Attending: Orthopaedic Surgery

## 2014-07-28 ENCOUNTER — Encounter (HOSPITAL_COMMUNITY): Payer: Self-pay | Admitting: General Practice

## 2014-07-28 DIAGNOSIS — Z8781 Personal history of (healed) traumatic fracture: Secondary | ICD-10-CM | POA: Insufficient documentation

## 2014-07-28 DIAGNOSIS — F329 Major depressive disorder, single episode, unspecified: Secondary | ICD-10-CM | POA: Insufficient documentation

## 2014-07-28 DIAGNOSIS — Z90710 Acquired absence of both cervix and uterus: Secondary | ICD-10-CM | POA: Insufficient documentation

## 2014-07-28 DIAGNOSIS — M2392 Unspecified internal derangement of left knee: Secondary | ICD-10-CM

## 2014-07-28 DIAGNOSIS — Z91040 Latex allergy status: Secondary | ICD-10-CM | POA: Diagnosis not present

## 2014-07-28 DIAGNOSIS — M94262 Chondromalacia, left knee: Secondary | ICD-10-CM | POA: Insufficient documentation

## 2014-07-28 DIAGNOSIS — Z7901 Long term (current) use of anticoagulants: Secondary | ICD-10-CM | POA: Insufficient documentation

## 2014-07-28 DIAGNOSIS — Z87891 Personal history of nicotine dependence: Secondary | ICD-10-CM | POA: Diagnosis not present

## 2014-07-28 DIAGNOSIS — M238X2 Other internal derangements of left knee: Secondary | ICD-10-CM | POA: Insufficient documentation

## 2014-07-28 DIAGNOSIS — F419 Anxiety disorder, unspecified: Secondary | ICD-10-CM | POA: Insufficient documentation

## 2014-07-28 DIAGNOSIS — D649 Anemia, unspecified: Secondary | ICD-10-CM | POA: Diagnosis not present

## 2014-07-28 DIAGNOSIS — E119 Type 2 diabetes mellitus without complications: Secondary | ICD-10-CM | POA: Diagnosis not present

## 2014-07-28 DIAGNOSIS — Z79899 Other long term (current) drug therapy: Secondary | ICD-10-CM | POA: Diagnosis not present

## 2014-07-28 DIAGNOSIS — G43909 Migraine, unspecified, not intractable, without status migrainosus: Secondary | ICD-10-CM | POA: Insufficient documentation

## 2014-07-28 DIAGNOSIS — Z9884 Bariatric surgery status: Secondary | ICD-10-CM | POA: Diagnosis not present

## 2014-07-28 HISTORY — DX: Type 2 diabetes mellitus without complications: E11.9

## 2014-07-28 HISTORY — DX: Unspecified convulsions: R56.9

## 2014-07-28 HISTORY — DX: Major depressive disorder, single episode, unspecified: F32.9

## 2014-07-28 HISTORY — DX: Phlebitis and thrombophlebitis of unspecified site: I80.9

## 2014-07-28 HISTORY — DX: Depression, unspecified: F32.A

## 2014-07-28 HISTORY — PX: KNEE ARTHROSCOPY: SHX127

## 2014-07-28 HISTORY — DX: Anxiety disorder, unspecified: F41.9

## 2014-07-28 LAB — BASIC METABOLIC PANEL
Anion gap: 7 (ref 5–15)
BUN: 11 mg/dL (ref 6–20)
CALCIUM: 9.1 mg/dL (ref 8.9–10.3)
CHLORIDE: 104 mmol/L (ref 101–111)
CO2: 28 mmol/L (ref 22–32)
CREATININE: 0.36 mg/dL — AB (ref 0.44–1.00)
GFR calc non Af Amer: >60 mL/min (ref >60)
GLUCOSE: 92 mg/dL (ref 65–99)
POTASSIUM: 4.4 mmol/L (ref 3.5–5.1)
SODIUM: 139 mmol/L (ref 135–145)

## 2014-07-28 LAB — CBC
HEMATOCRIT: 35.4 % — AB (ref 36.0–46.0)
Hemoglobin: 11.9 g/dL — ABNORMAL LOW (ref 12.0–15.0)
MCH: 27 pg (ref 26.0–34.0)
MCHC: 33.6 g/dL (ref 30.0–36.0)
MCV: 80.3 fL (ref 78.0–100.0)
Platelets: 229 10*3/uL (ref 150–400)
RBC: 4.41 MIL/uL (ref 3.87–5.11)
RDW: 15.8 % — AB (ref 11.5–15.5)
WBC: 6.8 10*3/uL (ref 4.0–10.5)

## 2014-07-28 LAB — GLUCOSE, CAPILLARY
Glucose-Capillary: 95 mg/dL (ref 65–99)
Glucose-Capillary: 84 mg/dL (ref 65–99)

## 2014-07-28 SURGERY — ARTHROSCOPY, KNEE
Anesthesia: General | Site: Knee | Laterality: Left

## 2014-07-28 MED ORDER — ONDANSETRON HCL 4 MG/2ML IJ SOLN
INTRAMUSCULAR | Status: AC
  Filled 2014-07-28: qty 2

## 2014-07-28 MED ORDER — ONDANSETRON HCL 4 MG/2ML IJ SOLN: 4.0000 mg | Freq: Four times a day (QID) | INTRAMUSCULAR | Status: DC | PRN

## 2014-07-28 MED ORDER — SODIUM CHLORIDE 0.9 % IR SOLN
Status: DC | PRN
  Administered 2014-07-28: 3000 mL

## 2014-07-28 MED ORDER — ROCURONIUM BROMIDE 50 MG/5ML IV SOLN
INTRAVENOUS | Status: AC
  Filled 2014-07-28: qty 1

## 2014-07-28 MED ORDER — OXYCODONE-ACETAMINOPHEN 5-325 MG PO TABS
ORAL_TABLET | ORAL | Status: AC
  Administered 2014-07-28: 2
  Filled 2014-07-28: qty 2

## 2014-07-28 MED ORDER — OXYCODONE HCL 5 MG/5ML PO SOLN: 5.0000 mg | Freq: Once | ORAL | Status: AC | PRN

## 2014-07-28 MED ORDER — HYDROMORPHONE HCL 1 MG/ML IJ SOLN
0.2500 mg | INTRAMUSCULAR | Status: DC | PRN
  Administered 2014-07-28 (×4): 0.5 mg via INTRAVENOUS

## 2014-07-28 MED ORDER — LIDOCAINE HCL (CARDIAC) 20 MG/ML IV SOLN
INTRAVENOUS | Status: DC | PRN
  Administered 2014-07-28: 100 mg via INTRAVENOUS

## 2014-07-28 MED ORDER — ARTIFICIAL TEARS OP OINT
TOPICAL_OINTMENT | OPHTHALMIC | Status: AC
  Filled 2014-07-28: qty 3.5

## 2014-07-28 MED ORDER — OXYCODONE-ACETAMINOPHEN 5-325 MG PO TABS: 1.0000 | ORAL_TABLET | ORAL | Status: DC | PRN

## 2014-07-28 MED ORDER — BUPIVACAINE HCL (PF) 0.25 % IJ SOLN
INTRAMUSCULAR | Status: DC | PRN
  Administered 2014-07-28: 30 mL

## 2014-07-28 MED ORDER — FENTANYL CITRATE (PF) 100 MCG/2ML IJ SOLN
INTRAMUSCULAR | Status: DC | PRN
  Administered 2014-07-28 (×5): 50 ug via INTRAVENOUS

## 2014-07-28 MED ORDER — CEFAZOLIN SODIUM-DEXTROSE 2-3 GM-% IV SOLR
2.0000 g | INTRAVENOUS | Status: AC
  Administered 2014-07-28: 2 g via INTRAVENOUS

## 2014-07-28 MED ORDER — FENTANYL CITRATE (PF) 250 MCG/5ML IJ SOLN
INTRAMUSCULAR | Status: AC
  Filled 2014-07-28: qty 5

## 2014-07-28 MED ORDER — HYDROMORPHONE HCL 1 MG/ML IJ SOLN
INTRAMUSCULAR | Status: AC
  Filled 2014-07-28: qty 1

## 2014-07-28 MED ORDER — MIDAZOLAM HCL 5 MG/5ML IJ SOLN
INTRAMUSCULAR | Status: DC | PRN
  Administered 2014-07-28: 2 mg via INTRAVENOUS

## 2014-07-28 MED ORDER — MORPHINE SULFATE 4 MG/ML IJ SOLN
INTRAMUSCULAR | Status: DC | PRN
  Administered 2014-07-28: 4 mg

## 2014-07-28 MED ORDER — OXYCODONE-ACETAMINOPHEN 5-325 MG PO TABS: 2.0000 | ORAL_TABLET | Freq: Once | ORAL | Status: DC

## 2014-07-28 MED ORDER — MORPHINE SULFATE 4 MG/ML IJ SOLN
INTRAMUSCULAR | Status: AC
  Filled 2014-07-28: qty 1

## 2014-07-28 MED ORDER — CEFAZOLIN SODIUM-DEXTROSE 2-3 GM-% IV SOLR
INTRAVENOUS | Status: AC
  Filled 2014-07-28: qty 50

## 2014-07-28 MED ORDER — OXYCODONE HCL 5 MG PO TABS
ORAL_TABLET | ORAL | Status: AC
  Filled 2014-07-28: qty 1

## 2014-07-28 MED ORDER — PROPOFOL 10 MG/ML IV BOLUS
INTRAVENOUS | Status: AC
Start: 2014-07-28 — End: 2014-07-28
  Filled 2014-07-28: qty 20

## 2014-07-28 MED ORDER — LIDOCAINE HCL (CARDIAC) 20 MG/ML IV SOLN
INTRAVENOUS | Status: AC
  Filled 2014-07-28: qty 5

## 2014-07-28 MED ORDER — OXYCODONE HCL 5 MG PO TABS
5.0000 mg | ORAL_TABLET | Freq: Once | ORAL | Status: AC | PRN
  Administered 2014-07-28: 5 mg via ORAL

## 2014-07-28 MED ORDER — BUPIVACAINE HCL (PF) 0.25 % IJ SOLN
INTRAMUSCULAR | Status: AC
  Filled 2014-07-28: qty 30

## 2014-07-28 MED ORDER — PROPOFOL 10 MG/ML IV BOLUS
INTRAVENOUS | Status: DC | PRN
  Administered 2014-07-28: 180 mg via INTRAVENOUS
  Administered 2014-07-28: 20 mg via INTRAVENOUS

## 2014-07-28 MED ORDER — ONDANSETRON HCL 4 MG/2ML IJ SOLN
INTRAMUSCULAR | Status: DC | PRN
  Administered 2014-07-28: 4 mg via INTRAVENOUS

## 2014-07-28 MED ORDER — MIDAZOLAM HCL 2 MG/2ML IJ SOLN
INTRAMUSCULAR | Status: AC
  Filled 2014-07-28: qty 2

## 2014-07-28 MED ORDER — SUCCINYLCHOLINE CHLORIDE 20 MG/ML IJ SOLN
INTRAMUSCULAR | Status: AC
  Filled 2014-07-28: qty 1

## 2014-07-28 MED ORDER — PROPOFOL 10 MG/ML IV BOLUS
INTRAVENOUS | Status: AC
  Filled 2014-07-28: qty 20

## 2014-07-28 MED ORDER — LACTATED RINGERS IV SOLN
INTRAVENOUS | Status: DC | PRN
  Administered 2014-07-28: 07:00:00 via INTRAVENOUS

## 2014-07-28 SURGICAL SUPPLY — 33 items
BANDAGE ELASTIC 6 VELCRO ST LF (GAUZE/BANDAGES/DRESSINGS) ×2 IMPLANT
BLADE CUTTER GATOR 3.5 (BLADE) ×2 IMPLANT
DRAPE ARTHROSCOPY W/POUCH 114 (DRAPES) ×2 IMPLANT
DRAPE U-SHAPE 47X51 STRL (DRAPES) ×2 IMPLANT
DRSG PAD ABDOMINAL 8X10 ST (GAUZE/BANDAGES/DRESSINGS) IMPLANT
DURAPREP 26ML APPLICATOR (WOUND CARE) ×2 IMPLANT
GAUZE SPONGE 4X4 12PLY STRL (GAUZE/BANDAGES/DRESSINGS) ×2 IMPLANT
GAUZE XEROFORM 1X8 LF (GAUZE/BANDAGES/DRESSINGS) ×2 IMPLANT
GLOVE BIOGEL PI IND STRL 6.5 (GLOVE) ×1 IMPLANT
GLOVE BIOGEL PI IND STRL 7.5 (GLOVE) ×1 IMPLANT
GLOVE BIOGEL PI IND STRL 8 (GLOVE) ×3 IMPLANT
GLOVE BIOGEL PI INDICATOR 6.5 (GLOVE) ×1
GLOVE BIOGEL PI INDICATOR 7.5 (GLOVE) ×1
GLOVE BIOGEL PI INDICATOR 8 (GLOVE) ×3
GLOVE SURG SS PI 6.5 STRL IVOR (GLOVE) ×4 IMPLANT
GLOVE SURG SS PI 7.5 STRL IVOR (GLOVE) ×2 IMPLANT
GOWN STRL REUS W/ TWL LRG LVL3 (GOWN DISPOSABLE) ×2 IMPLANT
GOWN STRL REUS W/ TWL XL LVL3 (GOWN DISPOSABLE) ×2 IMPLANT
GOWN STRL REUS W/TWL LRG LVL3 (GOWN DISPOSABLE) ×2
GOWN STRL REUS W/TWL XL LVL3 (GOWN DISPOSABLE) ×2
KIT ROOM TURNOVER OR (KITS) ×2 IMPLANT
MANIFOLD NEPTUNE II (INSTRUMENTS) ×2 IMPLANT
PACK ARTHROSCOPY DSU (CUSTOM PROCEDURE TRAY) ×2 IMPLANT
PAD ARMBOARD 7.5X6 YLW CONV (MISCELLANEOUS) ×4 IMPLANT
PAD CAST 4YDX4 CTTN HI CHSV (CAST SUPPLIES) ×1 IMPLANT
PADDING CAST COTTON 4X4 STRL (CAST SUPPLIES) ×1
PADDING CAST COTTON 6X4 STRL (CAST SUPPLIES) ×2 IMPLANT
SET ARTHROSCOPY TUBING (MISCELLANEOUS) ×1
SET ARTHROSCOPY TUBING LN (MISCELLANEOUS) ×1 IMPLANT
SPONGE GAUZE 4X4 12PLY STER LF (GAUZE/BANDAGES/DRESSINGS) ×2 IMPLANT
SPONGE LAP 4X18 X RAY DECT (DISPOSABLE) ×2 IMPLANT
SUT ETHILON 3 0 PS 1 (SUTURE) ×2 IMPLANT
TOWEL OR 17X26 10 PK STRL BLUE (TOWEL DISPOSABLE) ×2 IMPLANT

## 2014-07-28 NOTE — Anesthesia Preprocedure Evaluation (Signed)
Anesthesia Evaluation  Patient identified by MRN, date of birth, ID band Patient awake    Reviewed: Allergy & Precautions, NPO status , Patient's Chart, lab work & pertinent test results  Airway Mallampati: II   Neck ROM: full    Dental   Pulmonary former smoker,  breath sounds clear to auscultation        Cardiovascular negative cardio ROS  Rhythm:regular Rate:Normal     Neuro/Psych  Headaches, Seizures -,  Anxiety Depression    GI/Hepatic   Endo/Other  diabetes, Type 2  Renal/GU      Musculoskeletal   Abdominal   Peds  Hematology   Anesthesia Other Findings   Reproductive/Obstetrics                             Anesthesia Physical Anesthesia Plan  ASA: II  Anesthesia Plan: General   Post-op Pain Management:    Induction: Intravenous  Airway Management Planned: LMA  Additional Equipment:   Intra-op Plan:   Post-operative Plan:   Informed Consent: I have reviewed the patients History and Physical, chart, labs and discussed the procedure including the risks, benefits and alternatives for the proposed anesthesia with the patient or authorized representative who has indicated his/her understanding and acceptance.     Plan Discussed with: CRNA, Anesthesiologist and Surgeon  Anesthesia Plan Comments:         Anesthesia Quick Evaluation

## 2014-07-28 NOTE — Transfer of Care (Signed)
Immediate Anesthesia Transfer of Care Note  Patient: Alyssa Sanders  Procedure(s) Performed: Procedure(s): LEFT KNEE ARTHROSCOPY WITH DEBRIDEMENT (Left)  Patient Location: PACU  Anesthesia Type:General  Level of Consciousness: awake, alert , oriented and sedated  Airway & Oxygen Therapy: Patient Spontanous Breathing and Patient connected to nasal cannula oxygen  Post-op Assessment: Report given to RN, Post -op Vital signs reviewed and stable and Patient moving all extremities  Post vital signs: Reviewed and stable  Last Vitals:  Filed Vitals:   07/28/14 0910  BP: 108/45  Pulse: 64  Temp:   Resp: 8    Complications: No apparent anesthesia complications

## 2014-07-28 NOTE — Brief Op Note (Signed)
07/28/2014  8:11 AM  PATIENT:  Alyssa Sanders  49 y.o. 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 PROCEDURE:  Procedure(s): LEFT KNEE ARTHROSCOPY WITH DEBRIDEMENT (Left)  SURGEON:  Surgeon(s) and Role:    * Kathryne Hitchhristopher Y Angelyse Heslin, MD - Primary  PHYSICIAN ASSISTANT: Rexene EdisonGil Clark, PA-C  ANESTHESIA:   local and general  EBL:    minimal BLOOD ADMINISTERED:none  DRAINS: none   LOCAL MEDICATIONS USED:  BUPIVICAINE   SPECIMEN:  No Specimen  DISPOSITION OF SPECIMEN:  N/A  COUNTS:  YES  TOURNIQUET:  * No tourniquets in log *  DICTATION: .Other Dictation: Dictation Number [id number]  PLAN OF CARE: Discharge to home after PACU  PATIENT DISPOSITION:  PACU - hemodynamically stable.   Delay start of Pharmacological VTE agent (>24hrs) due to surgical blood loss or risk of bleeding: not applicable

## 2014-07-28 NOTE — Anesthesia Postprocedure Evaluation (Signed)
Anesthesia Post Note  Patient: Alyssa Sanders  Procedure(s) Performed: Procedure(s) (LRB): LEFT KNEE ARTHROSCOPY WITH DEBRIDEMENT (Left)  Anesthesia type: General  Patient location: PACU  Post pain: Pain level controlled and Adequate analgesia  Post assessment: Post-op Vital signs reviewed, Patient's Cardiovascular Status Stable, Respiratory Function Stable, Patent Airway and Pain level controlled  Last Vitals:  Filed Vitals:   07/28/14 0942  BP: 108/53  Pulse: 72  Temp: 36.6 C  Resp: 15    Post vital signs: Reviewed and stable  Level of consciousness: awake, alert  and oriented  Complications: No apparent anesthesia complications
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
## 2014-07-28 NOTE — Discharge Instructions (Signed)
Increase activities as comfort allows. Ice and elevation of left leg as needed for swelling. You can put all of your weight on leg. You can shower tomorrow and get your incisions wet. Place bandaids daily over your incisions

## 2014-07-28 NOTE — Anesthesia Procedure Notes (Signed)
Procedure Name: LMA Insertion Date/Time: 07/28/2014 7:35 AM Performed by: Fransisca KaufmannMEYER, Kodee Drury E Pre-anesthesia Checklist: Timeout performed, Emergency Drugs available, Patient identified, Suction available and Patient being monitored Patient Re-evaluated:Patient Re-evaluated prior to inductionOxygen Delivery Method: Circle system utilized Preoxygenation: Pre-oxygenation with 100% oxygen Intubation Type: IV induction Ventilation: Mask ventilation without difficulty LMA: LMA inserted LMA Size: 4.0 Number of attempts: 1 Placement Confirmation: positive ETCO2 and breath sounds checked- equal and bilateral Tube secured with: Tape Dental Injury: Teeth and Oropharynx as per pre-operative assessment

## 2014-07-28 NOTE — H&P (Signed)
Alyssa Sanders is an 49 Sanders.o. female.   Chief Complaint:   Severe left knee pain HPI:   49 yo female 5 months post open reduction/internal fixation of a left tibial plateau fracture.  Her fracture has since healed, but she continues to complain of severe left knee pain with locking and catching.  After the failure of conservative treatment, she wishes to proceed with a left knee arthroscopy.  The risks on infection and continued pain were discussed in detail with her.  Past Medical History  Diagnosis Date  . Migraine     hx of  . Anemia   . Neuropathy   . Back pain   . Blood clot associated with vein wall inflammation     superficial in left leg after surgery  . Diabetes mellitus without complication     diet controlled  . Anxiety   . Depression   . Seizures     last one 01/31/14    Past Surgical History  Procedure Laterality Date  . Gastric bypass  2001  . Torsoplasty  2002  . Abdominal hysterectomy  2008  . Hidradentitis suppurativa      groin and arm pits  . Rectal surgery      Removed cysts from rectal area due to infection  . Lumbar laminectomy  09/16/2011    Procedure: MICRODISCECTOMY LUMBAR LAMINECTOMY;  Surgeon: Eldred MangesMark C Yates, MD;  [facility];  Service: Orthopedics;  Laterality: Bilateral;  L4-5 laminotomy with bilateral microdiscectomy  . Orif tibia plateau Left 02/04/2014    Procedure: OPEN REDUCTION INTERNAL FIXATION (ORIF) LEFT BICONDYLAR TIBIAL PLATEAU FRACTURE;  Surgeon: Kathryne Hitchhristopher Sanders Collie Wernick, MD;  Location: [facility] OR;  Service: Orthopedics;  Laterality: Left;  . Colonoscopy      Family History  Problem Relation Age of Onset  . COPD Mother   . Congestive Heart Failure Mother   . Hypertension Father    Social History:  reports that she quit smoking about 16 years ago. She has never used smokeless tobacco. She reports that she does not drink alcohol or use illicit drugs.  Allergies:  Allergies  Allergen Reactions  . Latex Rash    Medications Prior to  Admission  Medication Sig Dispense Refill  . fentaNYL (DURAGESIC - DOSED MCG/HR) 25 MCG/HR patch Place 1 patch (25 mcg total) onto the skin every 3 (three) days. 10 patch 0  . ferrous sulfate 325 (65 FE) MG tablet Take 1 tablet (325 mg total) by mouth 3 (three) times daily with meals. 90 tablet 0  . LORazepam (ATIVAN) 1 MG tablet Take 1 mg by mouth 2 (two) times daily.    . methocarbamol (ROBAXIN) 500 MG tablet Take 2 tablets (1,000 mg total) by mouth every 6 (six) hours as needed for muscle spasms. Wean as tolerated to on pill four times a day in the next few weeks. 200 tablet 0  . polyethylene glycol (MIRALAX / GLYCOLAX) packet Take 17 g by mouth daily. For constipation 30 each 0  . senna-docusate (SENOKOT-S) 8.6-50 MG per tablet Take 2 tablets by mouth at bedtime. For constipation 60 tablet 0  . zolmitriptan (ZOMIG) 5 MG tablet Take 1 tablet (5 mg total) by mouth as needed. For migraines 5 tablet 0  . zolpidem (AMBIEN) 10 MG tablet Take 0.5 tablets (5 mg total) by mouth at bedtime as needed. For sleep 30 tablet 0  . amitriptyline (ELAVIL) 25 MG tablet Take 1 tablet (25 mg total) by mouth at bedtime. (Patient not taking: Reported  on 07/27/2014) 30 tablet 1  . Cyanocobalamin (VITAMIN B-12 IJ) Inject 1,000 Units as directed every 30 (thirty) days.    Marland Kitchen gabapentin (NEURONTIN) 300 MG capsule Take 1 capsule (300 mg total) by mouth 3 (three) times daily. (Patient not taking: Reported on 07/27/2014) 90 capsule 0  . pantoprazole (PROTONIX) 40 MG tablet Take 1 tablet (40 mg total) by mouth daily. (Patient not taking: Reported on 07/27/2014) 30 tablet 0  . warfarin (COUMADIN) 5 MG tablet Take on pill daily with supper on Mon, Wed, Fri, Sat and Sun. Take 1/1/2 pill on tues and thurs. Dr. Quillian Quince' office will adjust dose as needed. (Patient not taking: Reported on 07/27/2014) 45 tablet 0    Results for orders placed or performed during the hospital encounter of 07/28/14 (from the past 48 hour(s))  Glucose,  capillary     Status: None   Collection Time: 07/28/14  6:29 AM  Result Value Ref Range   Glucose-Capillary 95 65 - 99 mg/dL   No results found.  Review of Systems  Musculoskeletal: Positive for back pain.  All other systems reviewed and are negative.   Blood pressure 129/52, pulse 59, temperature 97.7 F (36.5 C), temperature source Oral, resp. rate 18, height  (1.549 m), weight 78.472 kg (173 lb), SpO2 100 %. Physical Exam  Constitutional: She is oriented to person, place, and time. She appears well-developed and well-nourished.  HENT:  Head: Normocephalic and atraumatic.  Eyes: EOM are normal. Pupils are equal, round, and reactive to light.  Neck: Normal range of motion. Neck supple.  Cardiovascular: Normal rate and regular rhythm.   Respiratory: Effort normal and breath sounds normal.  GI: Soft. Bowel sounds are normal.  Musculoskeletal:       Left knee: She exhibits swelling. Tenderness found. Lateral joint line tenderness noted.  Neurological: She is alert and oriented to person, place, and time.  Skin: Skin is warm and dry.  Psychiatric: She has a normal mood and affect.     Assessment/Plan Severe left knee pain post tibial plateau fracture/post ORIF 1)  To the OR today as an outpatient for a left knee arthroscopy with debridement.  Alyssa Sanders 07/28/2014, 7:05 AM

## 2014-07-29 ENCOUNTER — Encounter (HOSPITAL_COMMUNITY): Payer: Self-pay | Admitting: Orthopaedic Surgery

## 2014-12-05 ENCOUNTER — Ambulatory Visit
Admission: RE | Admit: 2014-12-05 | Discharge: 2014-12-05 | Disposition: A | Discharge status: Home / Self Care | Payer: BLUE CROSS/BLUE SHIELD | Source: Ambulatory Visit | Attending: Family Medicine | Admitting: Family Medicine

## 2014-12-05 ENCOUNTER — Other Ambulatory Visit: Payer: Self-pay | Admitting: Family Medicine

## 2014-12-05 DIAGNOSIS — M25462 Effusion, left knee: Secondary | ICD-10-CM | POA: Insufficient documentation

## 2014-12-05 DIAGNOSIS — M25569 Pain in unspecified knee: Secondary | ICD-10-CM

## 2014-12-05 DIAGNOSIS — Z9889 Other specified postprocedural states: Secondary | ICD-10-CM | POA: Insufficient documentation

## 2015-04-05 HISTORY — PX: MINOR IRRIGATION AND DEBRIDEMENT OF WOUND: SHX6239

## 2015-04-05 HISTORY — PX: PERIPHERALLY INSERTED CENTRAL CATHETER INSERTION: SHX2221

## 2015-07-04 ENCOUNTER — Other Ambulatory Visit: Payer: Self-pay | Admitting: Physician Assistant

## 2015-07-04 NOTE — Pre-Procedure Instructions (Signed)
Alyssa Sanders  07/04/2015      [facility] [address] [place] - [address] [place] [address] Phone: [phone] Fax: [phone]    Your procedure is scheduled on  Tuesday, May 9.              Report to [facility] Admitting at 1:30 A.M.                 Your surgery or procedure is scheduled for 3:30 PM   Call this number if you have problems the morning of surgery: [phone]                  For any other questions, please call [phone], Monday - Friday 8 AM - 4 PM.   Remember:  Do not eat food or drink liquids after midnight Monday, May 8.   Take these medicines the morning of surgery with A SIP OF WATER :sertraline (ZOLOFT).                May take pain medication; may take Diazepam (Valium) OR Lorazepam (ativan).                Hold Xarelto as instructed by Dr Magnus Ivan                 STOP take Vitamins, Herbal Medications, Aspirin, Aspirin Products, Ibuprofen (Advil), Naproxen (Aleve).    How to Manage Your Diabetes Before and After Surgery  Why is it important to control my blood sugar before and after surgery? . Improving blood sugar levels before and after surgery helps healing and can limit problems. . A way of improving blood sugar control is eating a healthy diet by: o  Eating less sugar and carbohydrates o  Increasing activity/exercise o  Talking with your doctor about reaching your blood sugar goals . High blood sugars (greater than 180 mg/dL) can raise your risk of infections and slow your recovery, so you will need to focus on controlling your diabetes during the weeks before surgery. . Make sure that the doctor who takes care of your diabetes knows about your planned surgery including the date and location.  How do I manage my blood sugar before surgery? . Check your blood sugar at least 4 times a day, starting 2 days before surgery, to make sure that the level is not too high or low. o Check  your blood sugar the morning of your surgery when you wake up and every 2 hours until you get to the Short Stay unit. . If your blood sugar is less than 70 mg/dL, you will need to treat for low blood sugar: o Do not take insulin. o Treat a low blood sugar (less than 70 mg/dL) with  cup of clear juice (cranberry or apple), 4 glucose tablets, OR glucose gel. o Recheck blood sugar in 15 minutes after treatment (to make sure it is greater than 70 mg/dL). If your blood sugar is not greater than 70 mg/dL on recheck, call [phone] for further instructions. . Report your blood sugar to the short stay nurse when you get to Short Stay.  . If you are admitted to the hospital after surgery: o Your blood sugar will be checked by the staff and you will probably be given insulin after surgery (instead of oral diabetes medicines) to make sure you have good blood sugar levels. o The goal for blood sugar control after surgery is  80-180 mg        Do not wear jewelry, make-up or nail polish.  Do not wear lotions, powders, or perfumes.   Do not shave 48 hours prior to surgery.    Do not bring valuables to the hospital.   [facility] any belongings or valuables.  Contacts, dentures or bridgework may not be worn into surgery.  Leave your suitcase in the car.  After surgery it may be brought to your room.  For patients admitted to the hospital, discharge time will be determined by your treatment team.  Patients discharged the day of surgery will not be allowed to drive home.    Special Instructions: Review  [place] - Preparing For Surgery  Please read over the following fact sheets that you were given. Pain Booklet, Coughing and Deep Breathing and Surgical Site Infection Prevention

## 2015-07-05 ENCOUNTER — Encounter (HOSPITAL_COMMUNITY)
Admission: RE | Admit: 2015-07-05 | Discharge: 2015-07-05 | Disposition: A | Discharge status: Home / Self Care | Payer: BLUE CROSS/BLUE SHIELD | Source: Ambulatory Visit | Attending: Orthopaedic Surgery | Admitting: Orthopaedic Surgery

## 2015-07-05 ENCOUNTER — Encounter (HOSPITAL_COMMUNITY): Payer: Self-pay

## 2015-07-05 DIAGNOSIS — R001 Bradycardia, unspecified: Secondary | ICD-10-CM | POA: Diagnosis not present

## 2015-07-05 DIAGNOSIS — Z86718 Personal history of other venous thrombosis and embolism: Secondary | ICD-10-CM | POA: Diagnosis not present

## 2015-07-05 DIAGNOSIS — Z981 Arthrodesis status: Secondary | ICD-10-CM | POA: Insufficient documentation

## 2015-07-05 DIAGNOSIS — F429 Obsessive-compulsive disorder, unspecified: Secondary | ICD-10-CM | POA: Diagnosis not present

## 2015-07-05 DIAGNOSIS — Z79899 Other long term (current) drug therapy: Secondary | ICD-10-CM | POA: Diagnosis not present

## 2015-07-05 DIAGNOSIS — E119 Type 2 diabetes mellitus without complications: Secondary | ICD-10-CM | POA: Insufficient documentation

## 2015-07-05 DIAGNOSIS — Z01812 Encounter for preprocedural laboratory examination: Secondary | ICD-10-CM | POA: Insufficient documentation

## 2015-07-05 DIAGNOSIS — Z87891 Personal history of nicotine dependence: Secondary | ICD-10-CM | POA: Diagnosis not present

## 2015-07-05 DIAGNOSIS — F329 Major depressive disorder, single episode, unspecified: Secondary | ICD-10-CM | POA: Diagnosis not present

## 2015-07-05 DIAGNOSIS — Z7901 Long term (current) use of anticoagulants: Secondary | ICD-10-CM | POA: Diagnosis not present

## 2015-07-05 DIAGNOSIS — Z01818 Encounter for other preprocedural examination: Secondary | ICD-10-CM | POA: Diagnosis not present

## 2015-07-05 HISTORY — DX: Presence of spectacles and contact lenses: Z97.3

## 2015-07-05 HISTORY — DX: Acute embolism and thrombosis of unspecified deep veins of unspecified lower extremity: I82.409

## 2015-07-05 LAB — CBC
HCT: 36.2 % (ref 36.0–46.0)
HEMOGLOBIN: 12.1 g/dL (ref 12.0–15.0)
MCH: 26.4 pg (ref 26.0–34.0)
MCHC: 33.4 g/dL (ref 30.0–36.0)
MCV: 79 fL (ref 78.0–100.0)
PLATELETS: 240 10*3/uL (ref 150–400)
RBC: 4.58 MIL/uL (ref 3.87–5.11)
RDW: 17.2 % — ABNORMAL HIGH (ref 11.5–15.5)
WBC: 7.3 10*3/uL (ref 4.0–10.5)

## 2015-07-05 LAB — BASIC METABOLIC PANEL
ANION GAP: 9 (ref 5–15)
BUN: 5 mg/dL — ABNORMAL LOW (ref 6–20)
CO2: 27 mmol/L (ref 22–32)
Calcium: 9.4 mg/dL (ref 8.9–10.3)
Chloride: 105 mmol/L (ref 101–111)
Creatinine, Ser: 0.48 mg/dL (ref 0.44–1.00)
GFR calc non Af Amer: >60 mL/min (ref >60)
Glucose, Bld: 94 mg/dL (ref 65–99)
Potassium: 4.5 mmol/L (ref 3.5–5.1)
SODIUM: 141 mmol/L (ref 135–145)

## 2015-07-05 LAB — GLUCOSE, CAPILLARY: Glucose-Capillary: 71 mg/dL (ref 65–99)

## 2015-07-05 NOTE — Progress Notes (Addendum)
PCP - Dr. Clayborn BignessKatherine Sanders Cardiologist - denies  EKG - 07/04/15 CXR - denies  Echo/stress test/cardiac cath- denies  Patient denies chest pain and shortness of breath at PAT appointment.    Patient states that her last dose of Xarelto will be on 5/6.    Patient informs nurse that she does not check her blood sugars at home.   Blood sugar 71 on arrival to PAT but denies feeling hypoglycemic.  Patient states that she did not have anything to eat or drink prior to arriving.  Nurse gave patient orange juice to drink during appointment.

## 2015-07-06 LAB — HEMOGLOBIN A1C
HEMOGLOBIN A1C: 5.2 % (ref 4.8–5.6)
MEAN PLASMA GLUCOSE: 103 mg/dL
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
## 2015-07-10 MED ORDER — CEFAZOLIN SODIUM-DEXTROSE 2-4 GM/100ML-% IV SOLN
2.0000 g | INTRAVENOUS | Status: AC
  Administered 2015-07-11: 2 g via INTRAVENOUS
  Filled 2015-07-10: qty 100

## 2015-07-11 ENCOUNTER — Ambulatory Visit (HOSPITAL_COMMUNITY)
Admission: RE | Admit: 2015-07-11 | Discharge: 2015-07-11 | Disposition: A | Discharge status: Home / Self Care | Payer: BLUE CROSS/BLUE SHIELD | Source: Ambulatory Visit | Attending: Orthopaedic Surgery | Admitting: Orthopaedic Surgery

## 2015-07-11 ENCOUNTER — Encounter (HOSPITAL_COMMUNITY)
Admission: RE | Disposition: A | Discharge status: Home / Self Care | Payer: Self-pay | Source: Ambulatory Visit | Attending: Orthopaedic Surgery

## 2015-07-11 ENCOUNTER — Ambulatory Visit (HOSPITAL_COMMUNITY): Payer: BLUE CROSS/BLUE SHIELD | Admitting: Certified Registered Nurse Anesthetist

## 2015-07-11 ENCOUNTER — Encounter (HOSPITAL_COMMUNITY): Payer: Self-pay | Admitting: *Deleted

## 2015-07-11 ENCOUNTER — Ambulatory Visit (HOSPITAL_COMMUNITY): Payer: BLUE CROSS/BLUE SHIELD | Admitting: Vascular Surgery

## 2015-07-11 DIAGNOSIS — Z7901 Long term (current) use of anticoagulants: Secondary | ICD-10-CM | POA: Insufficient documentation

## 2015-07-11 DIAGNOSIS — M25562 Pain in left knee: Secondary | ICD-10-CM | POA: Diagnosis not present

## 2015-07-11 DIAGNOSIS — Y838 Other surgical procedures as the cause of abnormal reaction of the patient, or of later complication, without mention of misadventure at the time of the procedure: Secondary | ICD-10-CM | POA: Insufficient documentation

## 2015-07-11 DIAGNOSIS — T8484XA Pain due to internal orthopedic prosthetic devices, implants and grafts, initial encounter: Secondary | ICD-10-CM | POA: Diagnosis not present

## 2015-07-11 DIAGNOSIS — Z86718 Personal history of other venous thrombosis and embolism: Secondary | ICD-10-CM | POA: Diagnosis not present

## 2015-07-11 DIAGNOSIS — Z9884 Bariatric surgery status: Secondary | ICD-10-CM | POA: Insufficient documentation

## 2015-07-11 DIAGNOSIS — E1140 Type 2 diabetes mellitus with diabetic neuropathy, unspecified: Secondary | ICD-10-CM | POA: Insufficient documentation

## 2015-07-11 DIAGNOSIS — Z87891 Personal history of nicotine dependence: Secondary | ICD-10-CM | POA: Insufficient documentation

## 2015-07-11 DIAGNOSIS — T85848A Pain due to other internal prosthetic devices, implants and grafts, initial encounter: Secondary | ICD-10-CM

## 2015-07-11 HISTORY — PX: HARDWARE REMOVAL: SHX979

## 2015-07-11 LAB — GLUCOSE, CAPILLARY
GLUCOSE-CAPILLARY: 60 mg/dL — AB (ref 65–99)
Glucose-Capillary: 71 mg/dL (ref 65–99)

## 2015-07-11 LAB — PROTIME-INR
INR: 1.23 (ref 0.00–1.49)
PROTHROMBIN TIME: 15.6 s — AB (ref 11.6–15.2)

## 2015-07-11 SURGERY — REMOVAL, HARDWARE
Anesthesia: General | Laterality: Left

## 2015-07-11 MED ORDER — FENTANYL CITRATE (PF) 100 MCG/2ML IJ SOLN
INTRAMUSCULAR | Status: DC | PRN
  Administered 2015-07-11: 50 ug via INTRAVENOUS
  Administered 2015-07-11: 25 ug via INTRAVENOUS
  Administered 2015-07-11 (×3): 50 ug via INTRAVENOUS
  Administered 2015-07-11: 25 ug via INTRAVENOUS

## 2015-07-11 MED ORDER — LACTATED RINGERS IV SOLN
INTRAVENOUS | Status: DC
  Administered 2015-07-11: 12:00:00 via INTRAVENOUS

## 2015-07-11 MED ORDER — CHLORHEXIDINE GLUCONATE 4 % EX LIQD: 60.0000 mL | Freq: Once | CUTANEOUS | Status: DC

## 2015-07-11 MED ORDER — ONDANSETRON HCL 4 MG/2ML IJ SOLN
4.0000 mg | Freq: Once | INTRAMUSCULAR | Status: AC | PRN
  Administered 2015-07-11: 4 mg via INTRAVENOUS

## 2015-07-11 MED ORDER — FENTANYL CITRATE (PF) 100 MCG/2ML IJ SOLN
INTRAMUSCULAR | Status: AC
  Administered 2015-07-11: 50 ug via INTRAVENOUS
  Filled 2015-07-11: qty 2

## 2015-07-11 MED ORDER — ONDANSETRON HCL 4 MG/2ML IJ SOLN
INTRAMUSCULAR | Status: AC
  Filled 2015-07-11: qty 2

## 2015-07-11 MED ORDER — ONDANSETRON HCL 4 MG/2ML IJ SOLN
INTRAMUSCULAR | Status: DC | PRN
  Administered 2015-07-11: 4 mg via INTRAVENOUS

## 2015-07-11 MED ORDER — PROPOFOL 10 MG/ML IV BOLUS
INTRAVENOUS | Status: DC | PRN
  Administered 2015-07-11: 150 mg via INTRAVENOUS

## 2015-07-11 MED ORDER — FENTANYL CITRATE (PF) 100 MCG/2ML IJ SOLN
25.0000 ug | INTRAMUSCULAR | Status: DC | PRN
  Administered 2015-07-11 (×3): 50 ug via INTRAVENOUS

## 2015-07-11 MED ORDER — LIDOCAINE 2% (20 MG/ML) 5 ML SYRINGE
INTRAMUSCULAR | Status: AC
  Filled 2015-07-11: qty 10

## 2015-07-11 MED ORDER — MEPERIDINE HCL 25 MG/ML IJ SOLN: 6.2500 mg | INTRAMUSCULAR | Status: DC | PRN

## 2015-07-11 MED ORDER — MIDAZOLAM HCL 2 MG/2ML IJ SOLN
INTRAMUSCULAR | Status: AC
  Filled 2015-07-11: qty 2

## 2015-07-11 MED ORDER — KETOROLAC TROMETHAMINE 30 MG/ML IJ SOLN
INTRAMUSCULAR | Status: AC
  Filled 2015-07-11: qty 1

## 2015-07-11 MED ORDER — FENTANYL CITRATE (PF) 250 MCG/5ML IJ SOLN
INTRAMUSCULAR | Status: AC
  Filled 2015-07-11: qty 5

## 2015-07-11 MED ORDER — OXYCODONE-ACETAMINOPHEN 5-325 MG PO TABS
ORAL_TABLET | ORAL | Status: AC
  Administered 2015-07-11: 2 via ORAL
  Filled 2015-07-11: qty 2

## 2015-07-11 MED ORDER — OXYCODONE-ACETAMINOPHEN 5-325 MG PO TABS: 1.0000 | ORAL_TABLET | ORAL | Status: AC | PRN

## 2015-07-11 MED ORDER — LIDOCAINE HCL (CARDIAC) 20 MG/ML IV SOLN
INTRAVENOUS | Status: DC | PRN
  Administered 2015-07-11: 100 mg via INTRAVENOUS

## 2015-07-11 MED ORDER — MIDAZOLAM HCL 5 MG/5ML IJ SOLN
INTRAMUSCULAR | Status: DC | PRN
  Administered 2015-07-11 (×2): 2 mg via INTRAVENOUS

## 2015-07-11 MED ORDER — ARTIFICIAL TEARS OP OINT
TOPICAL_OINTMENT | OPHTHALMIC | Status: AC
  Filled 2015-07-11: qty 3.5

## 2015-07-11 MED ORDER — BUPIVACAINE HCL (PF) 0.5 % IJ SOLN
INTRAMUSCULAR | Status: AC
  Filled 2015-07-11: qty 30

## 2015-07-11 MED ORDER — KETOROLAC TROMETHAMINE 30 MG/ML IJ SOLN
30.0000 mg | Freq: Once | INTRAMUSCULAR | Status: AC
  Administered 2015-07-11: 30 mg via INTRAVENOUS

## 2015-07-11 MED ORDER — OXYCODONE-ACETAMINOPHEN 5-325 MG PO TABS
1.0000 | ORAL_TABLET | Freq: Once | ORAL | Status: AC
  Administered 2015-07-11: 2 via ORAL

## 2015-07-11 MED ORDER — 0.9 % SODIUM CHLORIDE (POUR BTL) OPTIME
TOPICAL | Status: DC | PRN
  Administered 2015-07-11: 1000 mL

## 2015-07-11 MED ORDER — PROPOFOL 10 MG/ML IV BOLUS
INTRAVENOUS | Status: AC
  Filled 2015-07-11: qty 20

## 2015-07-11 MED ORDER — BUPIVACAINE HCL 0.5 % IJ SOLN
INTRAMUSCULAR | Status: DC | PRN
  Administered 2015-07-11: 20 mL

## 2015-07-11 SURGICAL SUPPLY — 50 items
BANDAGE ACE 6X5 VEL STRL LF (GAUZE/BANDAGES/DRESSINGS) ×3 IMPLANT
BANDAGE ELASTIC 4 VELCRO ST LF (GAUZE/BANDAGES/DRESSINGS) IMPLANT
BANDAGE ELASTIC 6 VELCRO ST LF (GAUZE/BANDAGES/DRESSINGS) IMPLANT
BANDAGE ESMARK 6X9 LF (GAUZE/BANDAGES/DRESSINGS) IMPLANT
BNDG COHESIVE 4X5 TAN STRL (GAUZE/BANDAGES/DRESSINGS) IMPLANT
BNDG ESMARK 6X9 LF (GAUZE/BANDAGES/DRESSINGS)
BNDG GAUZE ELAST 4 BULKY (GAUZE/BANDAGES/DRESSINGS) ×3 IMPLANT
COVER SURGICAL LIGHT HANDLE (MISCELLANEOUS) ×3 IMPLANT
CUFF TOURNIQUET SINGLE 34IN LL (TOURNIQUET CUFF) IMPLANT
CUFF TOURNIQUET SINGLE 44IN (TOURNIQUET CUFF) IMPLANT
DRAPE C-ARM 42X72 X-RAY (DRAPES) IMPLANT
DRAPE EXTREMITY T 121X128X90 (DRAPE) IMPLANT
DRAPE INCISE IOBAN 66X45 STRL (DRAPES) IMPLANT
DRAPE ORTHO SPLIT 77X108 STRL (DRAPES)
DRAPE PROXIMA HALF (DRAPES) IMPLANT
DRAPE SURG ORHT 6 SPLT 77X108 (DRAPES) IMPLANT
DRSG EMULSION OIL 3X3 NADH (GAUZE/BANDAGES/DRESSINGS) ×3 IMPLANT
DRSG PAD ABDOMINAL 8X10 ST (GAUZE/BANDAGES/DRESSINGS) ×3 IMPLANT
ELECT REM PT RETURN 9FT ADLT (ELECTROSURGICAL) ×3
ELECTRODE REM PT RTRN 9FT ADLT (ELECTROSURGICAL) ×1 IMPLANT
GAUZE SPONGE 4X4 12PLY STRL (GAUZE/BANDAGES/DRESSINGS) ×3 IMPLANT
GAUZE XEROFORM 5X9 LF (GAUZE/BANDAGES/DRESSINGS) ×3 IMPLANT
GLOVE BIO SURGEON STRL SZ8 (GLOVE) ×3 IMPLANT
GLOVE BIOGEL PI IND STRL 8 (GLOVE) ×1 IMPLANT
GLOVE BIOGEL PI INDICATOR 8 (GLOVE) ×2
GLOVE ORTHO TXT STRL SZ7.5 (GLOVE) ×3 IMPLANT
GOWN STRL REUS W/ TWL LRG LVL3 (GOWN DISPOSABLE) ×2 IMPLANT
GOWN STRL REUS W/ TWL XL LVL3 (GOWN DISPOSABLE) ×1 IMPLANT
GOWN STRL REUS W/TWL LRG LVL3 (GOWN DISPOSABLE) ×4
GOWN STRL REUS W/TWL XL LVL3 (GOWN DISPOSABLE) ×2
KIT BASIN OR (CUSTOM PROCEDURE TRAY) ×3 IMPLANT
KIT ROOM TURNOVER OR (KITS) ×3 IMPLANT
MANIFOLD NEPTUNE II (INSTRUMENTS) ×3 IMPLANT
NS IRRIG 1000ML POUR BTL (IV SOLUTION) ×3 IMPLANT
PACK GENERAL/GYN (CUSTOM PROCEDURE TRAY) ×3 IMPLANT
PAD ARMBOARD 7.5X6 YLW CONV (MISCELLANEOUS) ×6 IMPLANT
PAD CAST 4YDX4 CTTN HI CHSV (CAST SUPPLIES) ×1 IMPLANT
PADDING CAST COTTON 4X4 STRL (CAST SUPPLIES) ×2
SPONGE GAUZE 4X4 12PLY STER LF (GAUZE/BANDAGES/DRESSINGS) ×3 IMPLANT
STAPLER VISISTAT 35W (STAPLE) ×3 IMPLANT
STOCKINETTE IMPERVIOUS 9X36 MD (GAUZE/BANDAGES/DRESSINGS) IMPLANT
SUT ETHILON 2 0 FS 18 (SUTURE) ×3 IMPLANT
SUT ETHILON 4 0 FS 1 (SUTURE) IMPLANT
SUT VIC AB 0 CT1 27 (SUTURE) ×2
SUT VIC AB 0 CT1 27XBRD ANBCTR (SUTURE) ×1 IMPLANT
SUT VIC AB 2-0 CT1 27 (SUTURE)
SUT VIC AB 2-0 CT1 TAPERPNT 27 (SUTURE) IMPLANT
TOWEL OR 17X24 6PK STRL BLUE (TOWEL DISPOSABLE) ×3 IMPLANT
TOWEL OR 17X26 10 PK STRL BLUE (TOWEL DISPOSABLE) ×3 IMPLANT
WATER STERILE IRR 1000ML POUR (IV SOLUTION) IMPLANT

## 2015-07-11 NOTE — Anesthesia Procedure Notes (Signed)
Procedure Name: LMA Insertion Date/Time: 07/11/2015 12:48 PM Performed by: Margaree MackintoshYACOUB, Jaylan Hinojosa B Pre-anesthesia Checklist: Patient identified, Emergency Drugs available, Suction available, Patient being monitored and Timeout performed Patient Re-evaluated:Patient Re-evaluated prior to inductionOxygen Delivery Method: Circle system utilized Preoxygenation: Pre-oxygenation with 100% oxygen Intubation Type: IV induction LMA: LMA inserted LMA Size: 4.0 Number of attempts: 1 Placement Confirmation: positive ETCO2 and breath sounds checked- equal and bilateral Tube secured with: Tape Dental Injury: Teeth and Oropharynx as per pre-operative assessment

## 2015-07-11 NOTE — Transfer of Care (Signed)
Immediate Anesthesia Transfer of Care Note  Patient: Alyssa Sanders  Procedure(s) Performed: Procedure(s): HARDWARE REMOVAL LEFT KNEE (Left)  Patient Location: PACU  Anesthesia Type:General  Level of Consciousness: awake, alert  and oriented  Airway & Oxygen Therapy: Patient Spontanous Breathing and Patient connected to nasal cannula oxygen  Post-op Assessment: Report given to RN and Post -op Vital signs reviewed and stable  Post vital signs: Reviewed and stable  Last Vitals:  Filed Vitals:   07/11/15 1117  BP: 121/43  Pulse: 68  Temp: 36.6 C  Resp: 18    Last Pain: There were no vitals filed for this visit.       Complications: No apparent anesthesia complications

## 2015-07-11 NOTE — Brief Op Note (Signed)
07/11/2015  1:26 PM  PATIENT:  Alyssa Sanders  50 y.o. female  PRE-OPERATIVE DIAGNOSIS:  Painful, prominent hardware left knee  POST-OPERATIVE DIAGNOSIS:  Painful, prominent hardware left knee  PROCEDURE:  Procedure(s): HARDWARE REMOVAL LEFT KNEE (Left)  SURGEON:  Surgeon(s) and Role:    * Kathryne Hitchhristopher Y Marti Acebo, MD - Primary  PHYSICIAN ASSISTANT: Rexene EdisonGil Clark, PA-C  ANESTHESIA:   local and general  EBL:   < 50 cc  LOCAL MEDICATIONS USED:  MARCAINE     COUNTS:  YES  DICTATION: .Other Dictation: Dictation Number [id number]  PLAN OF CARE: Discharge to home after PACU  PATIENT DISPOSITION:  PACU - hemodynamically stable.   Delay start of Pharmacological VTE agent (>24hrs) due to surgical blood loss or risk of bleeding: not applicable

## 2015-07-11 NOTE — Discharge Instructions (Signed)
Increase your activities as comfort allows. You may put all of your weight on your left leg. Ice and elevation as needed for swelling. Leave your current dressing on and in place as well as clean and dry for the next 5 - 6 days. In 6 days you may get your actual incision wet daily in the shower, then new dry dressing daily as needed.
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
## 2015-07-11 NOTE — Anesthesia Preprocedure Evaluation (Signed)
Anesthesia Evaluation  Patient identified by MRN, date of birth, ID band Patient awake    Reviewed: Allergy & Precautions, H&P , NPO status , Patient's Chart, lab work & pertinent test results  Airway Mallampati: I  TM Distance: >3 FB Neck ROM: full    Dental no notable dental hx. (+) Teeth Intact   Pulmonary former smoker,    Pulmonary exam normal breath sounds clear to auscultation       Cardiovascular negative cardio ROS Normal cardiovascular exam     Neuro/Psych  Headaches, Seizures -,     GI/Hepatic negative GI ROS, Neg liver ROS,   Endo/Other  diabetes, Type 2  Renal/GU negative Renal ROS     Musculoskeletal   Abdominal Normal abdominal exam  (+)   Peds  Hematology   Anesthesia Other Findings   Reproductive/Obstetrics negative OB ROS                             Anesthesia Physical  Anesthesia Plan  ASA: II  Anesthesia Plan: General   Post-op Pain Management:    Induction: Intravenous  Airway Management Planned: LMA  Additional Equipment:   Intra-op Plan:   Post-operative Plan:   Informed Consent: I have reviewed the patients History and Physical, chart, labs and discussed the procedure including the risks, benefits and alternatives for the proposed anesthesia with the patient or authorized representative who has indicated his/her understanding and acceptance.     Plan Discussed with: CRNA and Surgeon  Anesthesia Plan Comments:        Anesthesia Quick Evaluation

## 2015-07-11 NOTE — Anesthesia Postprocedure Evaluation (Signed)
Anesthesia Post Note  Patient: Alyssa Sanders  Procedure(s) Performed: Procedure(s) (LRB): HARDWARE REMOVAL LEFT KNEE (Left)  Patient location during evaluation: PACU Anesthesia Type: General Level of consciousness: awake and sedated Pain management: pain level controlled Vital Signs Assessment: post-procedure vital signs reviewed and stable Respiratory status: spontaneous breathing Cardiovascular status: stable Postop Assessment: no signs of nausea or vomiting Anesthetic complications: no     Last Vitals:  Filed Vitals:   07/11/15 1530 07/11/15 1601  BP: 126/56   Pulse: 56 54  Temp: 36.6 C   Resp: 10 16    Last Pain:  Filed Vitals:   07/11/15 1603  PainSc: 7    Pain Goal:                 Aneeka Bowden JR,JOHN Topaz Raglin

## 2015-07-12 ENCOUNTER — Encounter (HOSPITAL_COMMUNITY): Payer: Self-pay | Admitting: Orthopaedic Surgery
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
## 2015-11-07 ENCOUNTER — Other Ambulatory Visit: Payer: Self-pay | Admitting: Family Medicine

## 2015-11-07 DIAGNOSIS — I808 Phlebitis and thrombophlebitis of other sites: Secondary | ICD-10-CM

## 2015-11-13 ENCOUNTER — Ambulatory Visit
Admission: RE | Admit: 2015-11-13 | Discharge: 2015-11-13 | Disposition: A | Discharge status: Home / Self Care | Payer: BLUE CROSS/BLUE SHIELD | Source: Ambulatory Visit | Attending: Family Medicine | Admitting: Family Medicine

## 2015-11-13 ENCOUNTER — Ambulatory Visit: Admission: RE | Admit: 2015-11-13 | Payer: No Typology Code available for payment source | Source: Ambulatory Visit

## 2015-11-13 DIAGNOSIS — I808 Phlebitis and thrombophlebitis of other sites: Secondary | ICD-10-CM | POA: Diagnosis not present

## 2016-04-26 ENCOUNTER — Other Ambulatory Visit: Payer: Self-pay | Admitting: Orthopaedic Surgery

## 2016-04-26 DIAGNOSIS — M4326 Fusion of spine, lumbar region: Secondary | ICD-10-CM

## 2016-05-01 ENCOUNTER — Other Ambulatory Visit: Payer: Self-pay

## 2016-05-01 ENCOUNTER — Inpatient Hospital Stay
Admission: RE | Admit: 2016-05-01 | Discharge: 2016-05-01 | Disposition: A | Discharge status: Home / Self Care | Payer: BLUE CROSS/BLUE SHIELD | Source: Ambulatory Visit | Attending: Orthopaedic Surgery | Admitting: Orthopaedic Surgery

## 2016-05-01 NOTE — Discharge Instructions (Signed)
Myelogram Discharge Instructions  1. Go home and rest quietly for the next 24 hours.  It is important to lie flat for the next 24 hours.  Get up only to go to the restroom.  You may lie in the bed or on a couch on your back, your stomach, your left side or your right side.  You may have one pillow under your head.  You may have pillows between your knees while you are on your side or under your knees while you are on your back.  2. DO NOT drive today.  Recline the seat as far back as it will go, while still wearing your seat belt, on the way home.  3. You may get up to go to the bathroom as needed.  You may sit up for 10 minutes to eat.  You may resume your normal diet and medications unless otherwise indicated.  Drink lots of extra fluids today and tomorrow.  4. The incidence of headache, nausea, or vomiting is about 5% (one in 20 patients).  If you develop a headache, lie flat and drink plenty of fluids until the headache goes away.  Caffeinated beverages may be helpful.  If you develop severe nausea and vomiting or a headache that does not go away with flat bed rest, call [phone].  5. You may resume normal activities after your 24 hours of bed rest is over; however, do not exert yourself strongly or do any heavy lifting tomorrow. If when you get up you have a headache when standing, go back to bed and force fluids for another 24 hours.  6. Call your physician for a follow-up appointment.  The results of your myelogram will be sent directly to your physician by the following day.  7. If you have any questions or if complications develop after you arrive home, please call [phone].  Discharge instructions have been explained to the patient.  The patient, or the person responsible for the patient, fully understands these instructions.       May resume Zomig on May 03, 2106, after 9:30 am.

## 2016-05-10 ENCOUNTER — Ambulatory Visit
Admission: RE | Admit: 2016-05-10 | Discharge: 2016-05-10 | Disposition: A | Discharge status: Home / Self Care | Payer: BLUE CROSS/BLUE SHIELD | Source: Ambulatory Visit | Attending: Orthopaedic Surgery | Admitting: Orthopaedic Surgery

## 2016-05-10 VITALS — BP 124/64 | HR 48

## 2016-05-10 DIAGNOSIS — M5126 Other intervertebral disc displacement, lumbar region: Secondary | ICD-10-CM

## 2016-05-10 DIAGNOSIS — M4326 Fusion of spine, lumbar region: Secondary | ICD-10-CM

## 2016-05-10 MED ORDER — DIAZEPAM 5 MG PO TABS
10.0000 mg | ORAL_TABLET | Freq: Once | ORAL | Status: AC
  Administered 2016-05-10: 10 mg via ORAL

## 2016-05-10 MED ORDER — IOPAMIDOL (ISOVUE-M 200) INJECTION 41%
15.0000 mL | Freq: Once | INTRAMUSCULAR | Status: AC
  Administered 2016-05-10: 15 mL via INTRATHECAL

## 2016-05-10 MED ORDER — HYDROXYZINE HCL 50 MG/ML IM SOLN
25.0000 mg | Freq: Once | INTRAMUSCULAR | Status: AC
  Administered 2016-05-10: 25 mg via INTRAMUSCULAR

## 2016-05-10 MED ORDER — MEPERIDINE HCL 100 MG/ML IJ SOLN
75.0000 mg | Freq: Once | INTRAMUSCULAR | Status: AC
  Administered 2016-05-10: 75 mg via INTRAMUSCULAR

## 2016-05-10 NOTE — Discharge Instructions (Signed)
Myelogram Discharge Instructions  1. Go home and rest quietly for the next 24 hours.  It is important to lie flat for the next 24 hours.  Get up only to go to the restroom.  You may lie in the bed or on a couch on your back, your stomach, your left side or your right side.  You may have one pillow under your head.  You may have pillows between your knees while you are on your side or under your knees while you are on your back.  2. DO NOT drive today.  Recline the seat as far back as it will go, while still wearing your seat belt, on the way home.  3. You may get up to go to the bathroom as needed.  You may sit up for 10 minutes to eat.  You may resume your normal diet and medications unless otherwise indicated.  Drink plenty of extra fluids today and tomorrow.  4. The incidence of a spinal headache with nausea and/or vomiting is about 5% (one in 20 patients).  If you develop a headache, lie flat and drink plenty of fluids until the headache goes away.  Caffeinated beverages may be helpful.  If you develop severe nausea and vomiting or a headache that does not go away with flat bed rest, call [phone].  5. You may resume normal activities after your 24 hours of bed rest is over; however, do not exert yourself strongly or do any heavy lifting tomorrow.  6. Call your physician for a follow-up appointment.    You may resume Zomig on Saturday, May 11, 2016 after 10:30a.m.

## 2016-05-10 NOTE — Progress Notes (Signed)
Patient states she has been off Zomig for at least the past two days.  jkl

## 2017-05-05 IMAGING — XA DG MYELOGRAPHY LUMBAR INJ LUMBOSACRAL
13 series · 13 of 13 positions shown · IV contrast (omnipaque)
Comparison: CT 04/30/2015 and previous

CLINICAL DATA: previous lumbar fusion.  Persistent pain.

EXAM:
LUMBAR MYELOGRAM
CT LUMBAR SPINE WITH INTRATHECAL CONTRAST
TECHNIQUE: The procedure, risks (including but not limited to bleeding,
infection, organ damage ), benefits, and alternatives were explained
to the patient. Questions regarding the procedure were encouraged
and answered. The patient understands and consents to the procedure.
An appropriate entry site was determined under fluoroscopy. Operator
donned sterile gloves and mask. Skin site was marked, prepped with
Betadine, and draped in usual sterile fashion, and infiltrated
locally with 1% lidocaine. A 22 gauge spinal needle was advanced
into the thecal sac at L4-5 from a right parasagittal approach.
Clear colorless CSF returned. 17 ml Omnipaque 180 were administered
intrathecally for lumbar myelography, followed by axial CT scanning
of the lumbar spine. I personally performed the lumbar puncture and
administered the intrathecal contrast. I also personally supervised
acquisition of the myelogram images. Coronal and sagittal
reconstructions were generated from the axial scan.
FLUOROSCOPY TIME:  42 seconds (340 uFymR DAP)

[Series 1: w lumbar spine lat · 0.15mm/px · 1 of 1 slices shown]
[im 1/1]
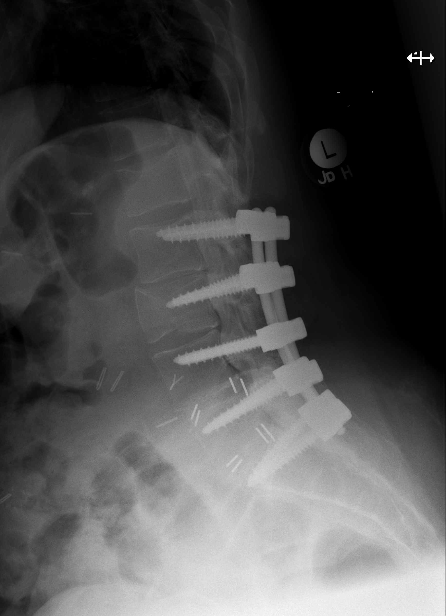

[Series 1: vasc adipose · 1 of 1 slices shown (1 of 10)]
[im 1/1]
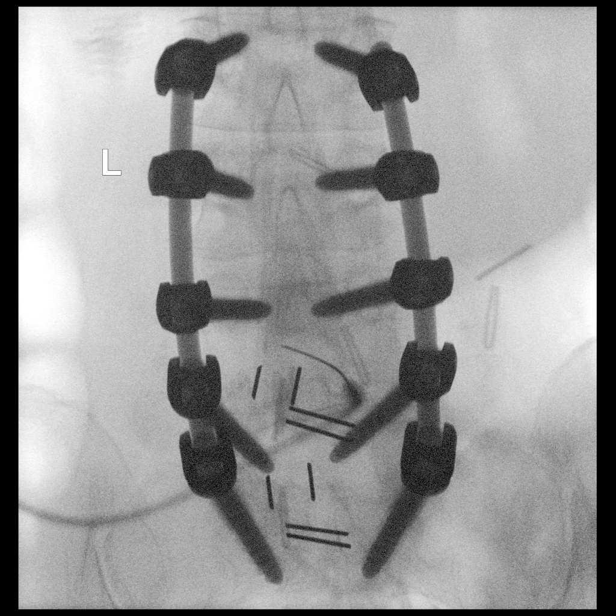

[Series 2: vasc adipose · 1 of 1 slices shown (2 of 10)]
[im 1/1]
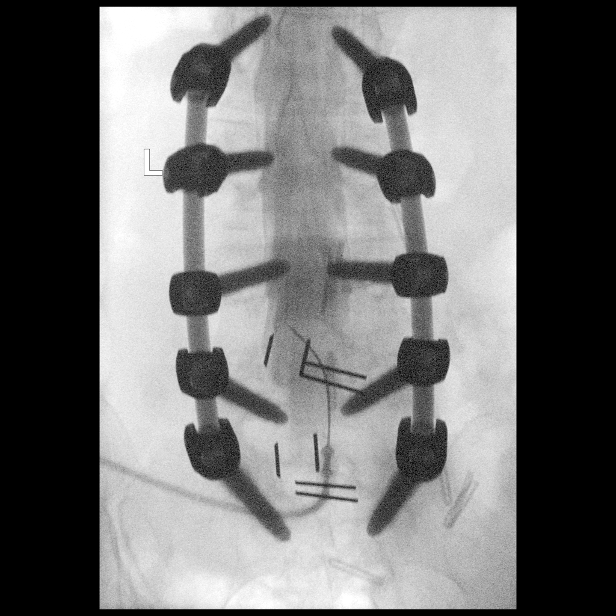

[Series 2: w lumbar spine flexion · 0.15mm/px · 1 of 1 slices shown]
[im 1/1]
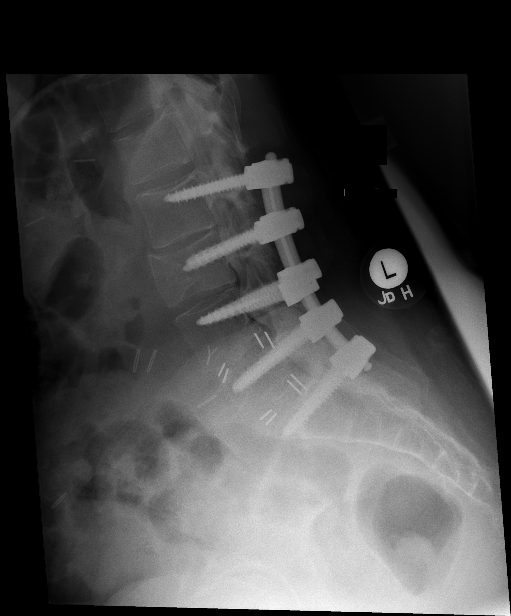

[Series 3: vasc adipose · 1 of 1 slices shown (3 of 10)]
[im 1/1]
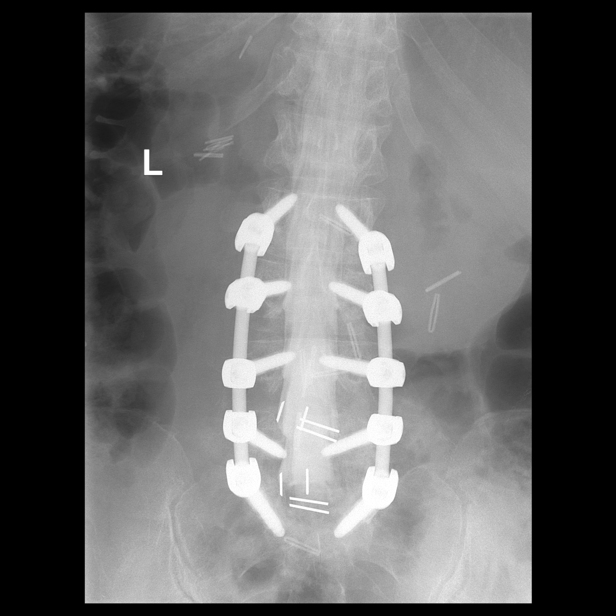

[Series 3: w lumbar spine extension · 0.15mm/px · 1 of 1 slices shown]
[im 1/1]
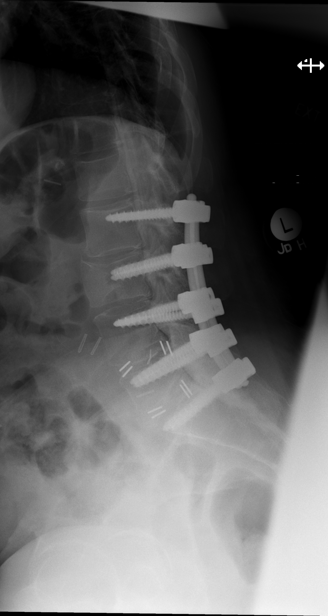

[Series 4: vasc adipose · 1 of 1 slices shown (4 of 10)]
[im 1/1]
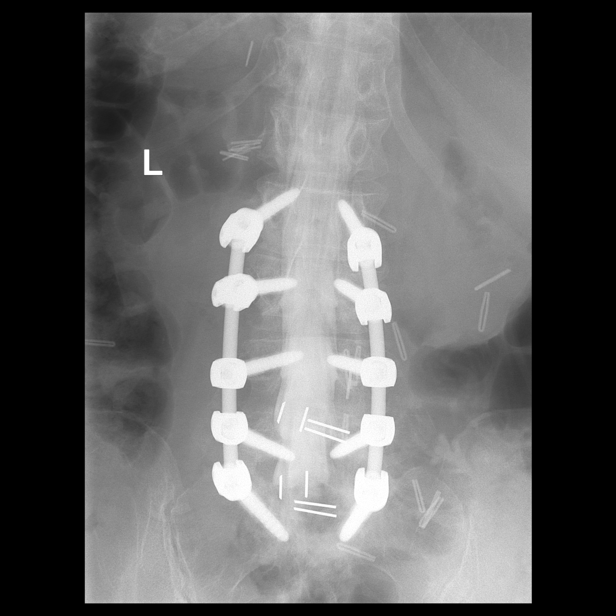

[Series 5: vasc adipose · 1 of 1 slices shown (5 of 10)]
[im 1/1]
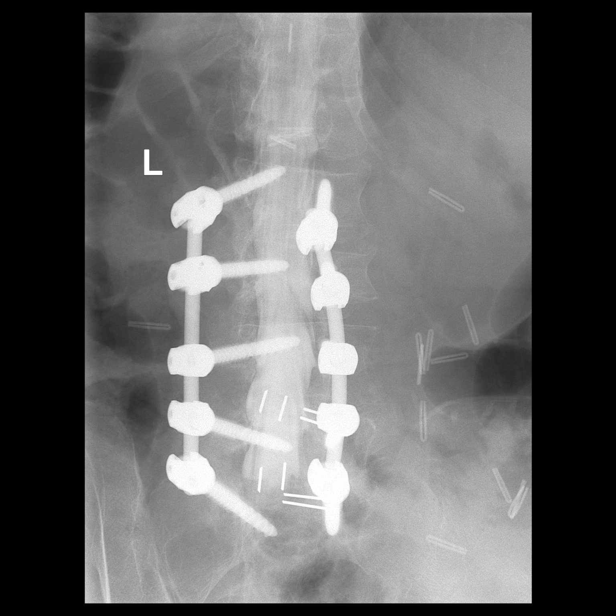

[Series 6: vasc adipose · 1 of 1 slices shown (6 of 10)]
[im 1/1]
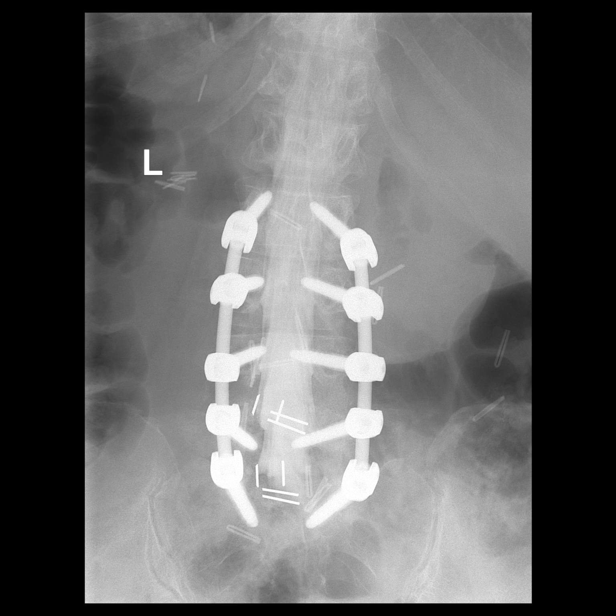

[Series 7: vasc adipose · 1 of 1 slices shown (7 of 10)]
[im 1/1]
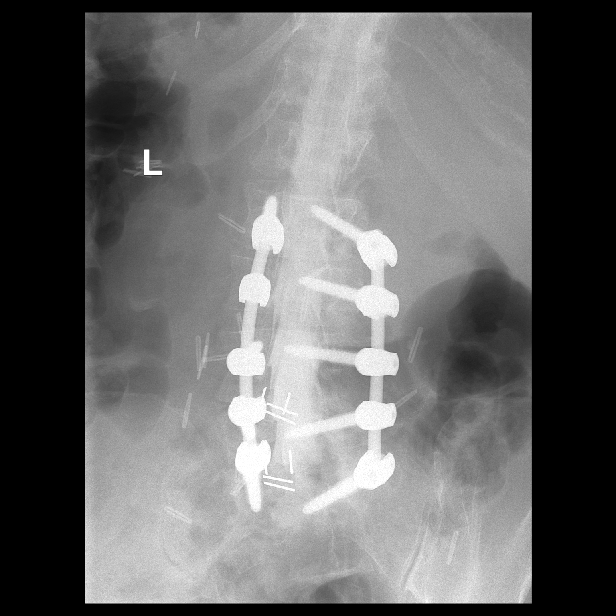

[Series 8: vasc adipose · 1 of 1 slices shown (8 of 10)]
[im 1/1]
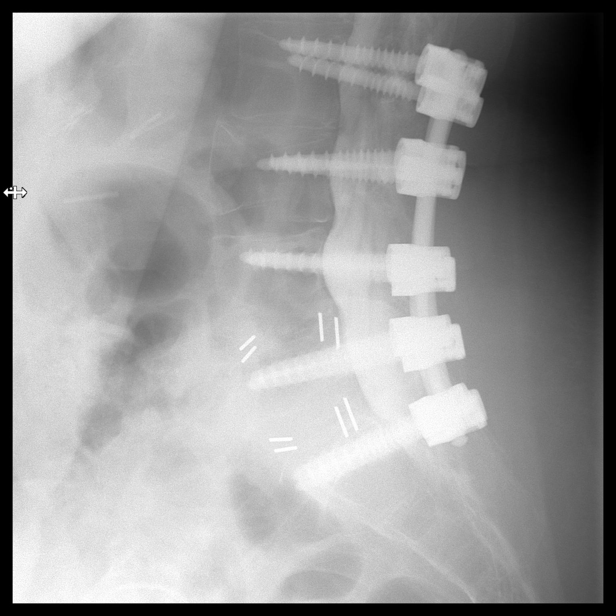

[Series 9: vasc adipose · 1 of 1 slices shown (9 of 10)]
[im 1/1]
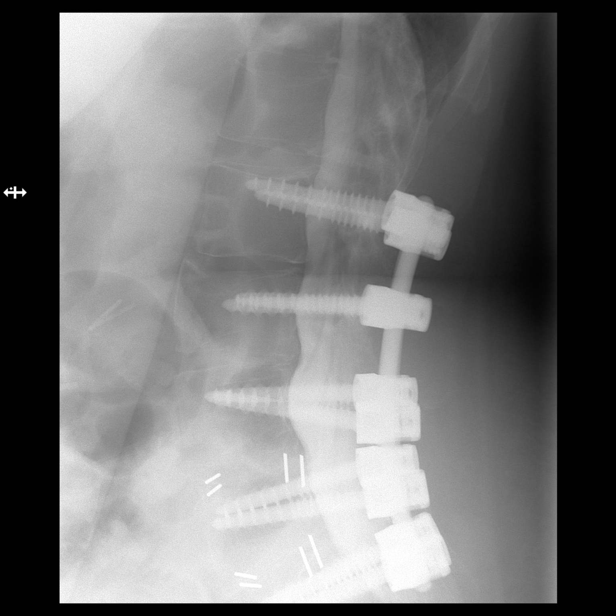

[Series 10: vasc adipose · 1 of 1 slices shown (10 of 10)]
[im 1/1]
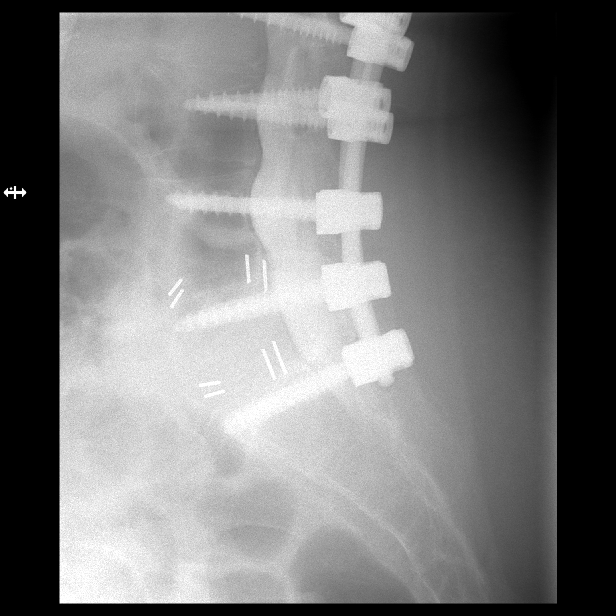

[13 of 13 positions shown; findings below may reference images not displayed]

FINDINGS: Numbering scheme reflects that used on prior study, the 5 non
rib-bearing lumbar segments assigned L1-L5. Normal alignment. No
evidence of dynamic instability on standing lateral flexion/
extension. Changes of instrumented posterior fusion with bilateral
pedicle screws and vertical interconnecting rods L2-S1, hardware
project in expected location, intact without surrounding lucency.

T12-L1: Interspace unremarkable. Central canal and foramina patent.
Mild compression deformity of superior endplate L1, stable. Normal
conus behind L1 vertebral body.

L1-2:  Interspace unremarkable.  Central canal and foramina patent.

L2-3: Fusion hardware as above. Solid osseous fusion across
posterior elements. Central canal and foramina patent. Mild
narrowing of the interspace. Stable L3 vertebral body fracture
deformity in the coronal plane.

L3-4: Fusion hardware stable. Central canal and foramina patent.
Mild narrowing of the interspace as before.

L4-5: Solid-appearing fusion across graft in the interspace, some
image degradation secondary to streak artifact. Posterior fusion
hardware as above. Solid osseous fusion across posterior elements.
Central canal and foramina patent. Previous left laminotomy.

L5-S1: Solid-appearing fusion across graft in the interspace, some
image degradation secondary to streak artifact. Posterior fusion
hardware as above. Central canal and foramina patent.

Visualized paraspinal soft tissues unremarkable.
IMPRESSION: 1. Stable posterior instrumented fusion L2-S1 and solid-appearing
interbody fusion L4-S1, without evident complication.
2. No significant adjacent level disease identified at L1-2.
3. Stable old compression deformity, L1 superior endplate.

## 2017-05-05 IMAGING — CT CT L SPINE W/ CM
1 of 6 series · 5 of 14 positions shown, 7 images · IV contrast (omnipaque)
Comparison: CT 04/30/2015 and previous

CLINICAL DATA: previous lumbar fusion.  Persistent pain.

EXAM:
LUMBAR MYELOGRAM
CT LUMBAR SPINE WITH INTRATHECAL CONTRAST
TECHNIQUE: The procedure, risks (including but not limited to bleeding,
infection, organ damage ), benefits, and alternatives were explained
to the patient. Questions regarding the procedure were encouraged
and answered. The patient understands and consents to the procedure.
An appropriate entry site was determined under fluoroscopy. Operator
donned sterile gloves and mask. Skin site was marked, prepped with
Betadine, and draped in usual sterile fashion, and infiltrated
locally with 1% lidocaine. A 22 gauge spinal needle was advanced
into the thecal sac at L4-5 from a right parasagittal approach.
Clear colorless CSF returned. 17 ml Omnipaque 180 were administered
intrathecally for lumbar myelography, followed by axial CT scanning
of the lumbar spine. I personally performed the lumbar puncture and
administered the intrathecal contrast. I also personally supervised
acquisition of the myelogram images. Coronal and sagittal
reconstructions were generated from the axial scan.
FLUOROSCOPY TIME:  42 seconds (340 uFymR DAP)

[Series 2: l spine soft (person_name) · axial · 0.27mm/px · z∈[-229,-85]mm · 5 of 73 slices shown, 7 images]
[im 13/73  soft-tissue]
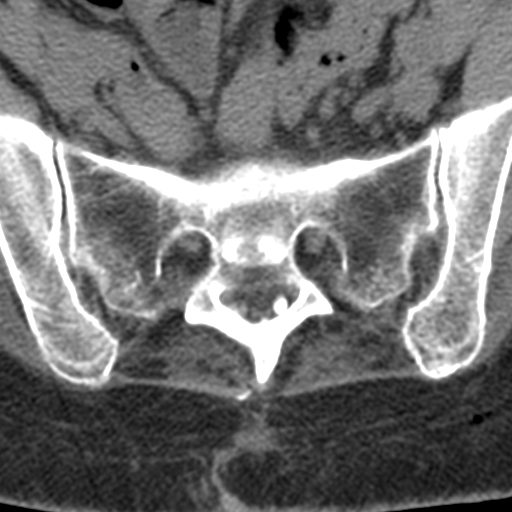
[im 13/73  bone]
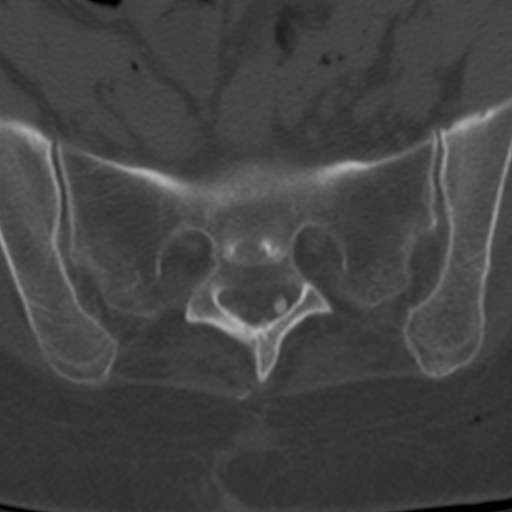
[im 25/73  bone]
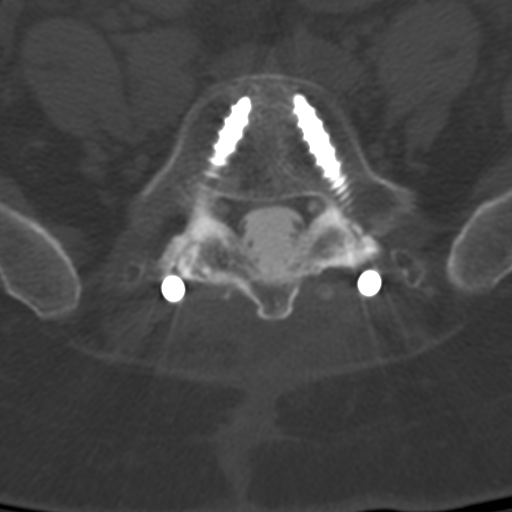
[im 37/73  bone]
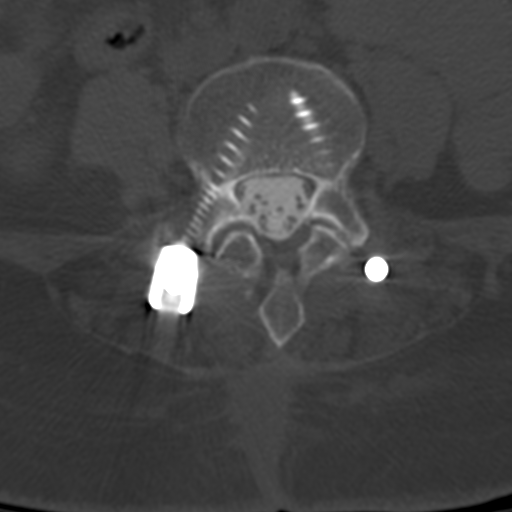
[im 49/73  bone]
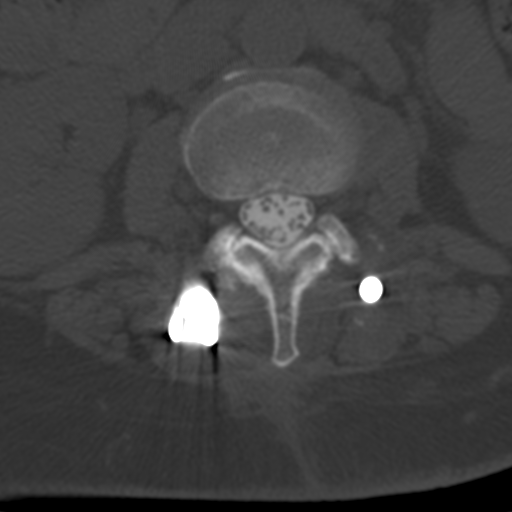
[im 61/73  soft-tissue]
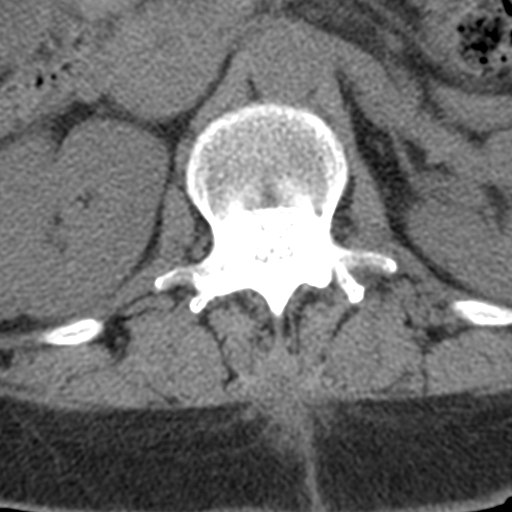
[im 61/73  bone]
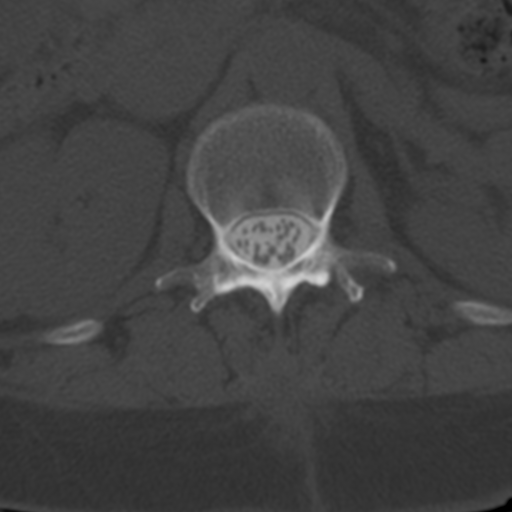

[5 of 14 positions shown; findings below may reference images not displayed]

FINDINGS: Numbering scheme reflects that used on prior study, the 5 non
rib-bearing lumbar segments assigned L1-L5. Normal alignment. No
evidence of dynamic instability on standing lateral flexion/
extension. Changes of instrumented posterior fusion with bilateral
pedicle screws and vertical interconnecting rods L2-S1, hardware
project in expected location, intact without surrounding lucency.

T12-L1: Interspace unremarkable. Central canal and foramina patent.
Mild compression deformity of superior endplate L1, stable. Normal
conus behind L1 vertebral body.

L1-2:  Interspace unremarkable.  Central canal and foramina patent.

L2-3: Fusion hardware as above. Solid osseous fusion across
posterior elements. Central canal and foramina patent. Mild
narrowing of the interspace. Stable L3 vertebral body fracture
deformity in the coronal plane.

L3-4: Fusion hardware stable. Central canal and foramina patent.
Mild narrowing of the interspace as before.

L4-5: Solid-appearing fusion across graft in the interspace, some
image degradation secondary to streak artifact. Posterior fusion
hardware as above. Solid osseous fusion across posterior elements.
Central canal and foramina patent. Previous left laminotomy.

L5-S1: Solid-appearing fusion across graft in the interspace, some
image degradation secondary to streak artifact. Posterior fusion
hardware as above. Central canal and foramina patent.

Visualized paraspinal soft tissues unremarkable.
IMPRESSION: 1. Stable posterior instrumented fusion L2-S1 and solid-appearing
interbody fusion L4-S1, without evident complication.
2. No significant adjacent level disease identified at L1-2.
3. Stable old compression deformity, L1 superior endplate.

## 2019-05-03 ENCOUNTER — Other Ambulatory Visit: Payer: Self-pay | Admitting: Internal Medicine

## 2019-05-03 DIAGNOSIS — Z1231 Encounter for screening mammogram for malignant neoplasm of breast: Secondary | ICD-10-CM

## 2019-07-12 ENCOUNTER — Ambulatory Visit
Admission: RE | Admit: 2019-07-12 | Discharge: 2019-07-12 | Disposition: A | Discharge status: Home / Self Care | Payer: BC Managed Care – PPO | Source: Ambulatory Visit | Attending: Internal Medicine | Admitting: Internal Medicine

## 2019-07-12 ENCOUNTER — Other Ambulatory Visit: Payer: Self-pay

## 2019-07-12 DIAGNOSIS — Z1231 Encounter for screening mammogram for malignant neoplasm of breast: Secondary | ICD-10-CM | POA: Diagnosis present

## 2022-04-12 ENCOUNTER — Other Ambulatory Visit: Payer: Self-pay | Admitting: Rehabilitation

## 2022-04-12 DIAGNOSIS — M4326 Fusion of spine, lumbar region: Secondary | ICD-10-CM

## 2022-04-30 ENCOUNTER — Ambulatory Visit
Admission: RE | Admit: 2022-04-30 | Discharge: 2022-04-30 | Disposition: A | Discharge status: Home / Self Care | Payer: BC Managed Care – PPO | Source: Ambulatory Visit | Attending: Rehabilitation | Admitting: Rehabilitation

## 2022-04-30 DIAGNOSIS — M4326 Fusion of spine, lumbar region: Secondary | ICD-10-CM

## 2022-04-30 MED ORDER — GADOPICLENOL 0.5 MMOL/ML IV SOLN
7.5000 mL | Freq: Once | INTRAVENOUS | Status: AC | PRN
  Administered 2022-04-30: 7.5 mL via INTRAVENOUS

## 2022-11-06 ENCOUNTER — Other Ambulatory Visit: Payer: Self-pay | Admitting: Internal Medicine

## 2022-11-06 DIAGNOSIS — Z1231 Encounter for screening mammogram for malignant neoplasm of breast: Secondary | ICD-10-CM

## 2022-11-27 ENCOUNTER — Inpatient Hospital Stay: Admission: RE | Admit: 2022-11-27 | Payer: BC Managed Care – PPO | Source: Ambulatory Visit

## 2022-11-29 ENCOUNTER — Ambulatory Visit
Admission: RE | Admit: 2022-11-29 | Discharge: 2022-11-29 | Disposition: A | Discharge status: Home / Self Care | Payer: BC Managed Care – PPO | Source: Ambulatory Visit | Attending: Internal Medicine | Admitting: Internal Medicine

## 2022-11-29 DIAGNOSIS — Z1231 Encounter for screening mammogram for malignant neoplasm of breast: Secondary | ICD-10-CM | POA: Diagnosis present

## 2022-12-16 ENCOUNTER — Ambulatory Visit: Payer: BC Managed Care – PPO

## 2023-05-12 ENCOUNTER — Ambulatory Visit
Admission: EM | Admit: 2023-05-12 | Discharge: 2023-05-12 | Disposition: A | Discharge status: Home / Self Care | Attending: Emergency Medicine | Admitting: Emergency Medicine

## 2023-05-12 ENCOUNTER — Encounter: Payer: Self-pay | Admitting: Emergency Medicine

## 2023-05-12 ENCOUNTER — Other Ambulatory Visit: Payer: Self-pay

## 2023-05-12 DIAGNOSIS — B349 Viral infection, unspecified: Secondary | ICD-10-CM | POA: Diagnosis not present

## 2023-05-12 DIAGNOSIS — Z20828 Contact with and (suspected) exposure to other viral communicable diseases: Secondary | ICD-10-CM

## 2023-05-12 LAB — POC COVID19/FLU A&B COMBO
Covid Antigen, POC: NEGATIVE
Influenza A Antigen, POC: NEGATIVE
Influenza B Antigen, POC: NEGATIVE

## 2023-05-12 MED ORDER — OSELTAMIVIR PHOSPHATE 75 MG PO CAPS: 75.0000 mg | ORAL_CAPSULE | Freq: Two times a day (BID) | ORAL | 0 refills | Status: AC
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
## 2023-05-12 NOTE — Discharge Instructions (Signed)
 Your symptoms today are most likely being caused by a virus and should steadily improve in time it can take up to 7 to 10 days before you truly start to see a turnaround however things will get better  COVID and flu testing are negative however due to exposure being placed on Tamiflu prophylactically  Take Tamiflu every morning and every evening for 5 days    You can take Tylenol and/or Ibuprofen as needed for fever reduction and pain relief.   For cough: honey 1/2 to 1 teaspoon (you can dilute the honey in water or another fluid).  You can also use guaifenesin and dextromethorphan for cough. You can use a humidifier for chest congestion and cough.  If you don't have a humidifier, you can sit in the bathroom with the hot shower running.      For sore throat: try warm salt water gargles, cepacol lozenges, throat spray, warm tea or water with lemon/honey, popsicles or ice, or OTC cold relief medicine for throat discomfort.   For congestion: take a daily anti-histamine like Zyrtec, Claritin, and a oral decongestant, such as pseudoephedrine.  You can also use Flonase 1-2 sprays in each nostril daily.   It is important to stay hydrated: drink plenty of fluids (water, gatorade/powerade/pedialyte, juices, or teas) to keep your throat moisturized and help further relieve irritation/discomfort.

## 2023-05-12 NOTE — ED Triage Notes (Signed)
 Patient presents to [facility] for evaluation of cough, sore throat, chest congestion, shivering since last night.  Alyssa Sanders is positive for the flu.
